# Patient Record
Sex: Female | Born: 1980 | Race: White | Hispanic: No | Marital: Married | Smoking: Former smoker
Health system: Southern US, Community
[De-identification: ages and names within clinical notes are randomized; demographics above are authoritative.]

## PROBLEM LIST (undated history)

## (undated) DIAGNOSIS — A6000 Herpesviral infection of urogenital system, unspecified: Secondary | ICD-10-CM

## (undated) DIAGNOSIS — N920 Excessive and frequent menstruation with regular cycle: Secondary | ICD-10-CM

## (undated) DIAGNOSIS — N840 Polyp of corpus uteri: Secondary | ICD-10-CM

## (undated) DIAGNOSIS — K2960 Other gastritis without bleeding: Secondary | ICD-10-CM

## (undated) HISTORY — DX: Other gastritis without bleeding: K29.60

## (undated) HISTORY — DX: Polyp of corpus uteri: N84.0

## (undated) HISTORY — DX: Excessive and frequent menstruation with regular cycle: N92.0

## (undated) HISTORY — DX: Herpesviral infection of urogenital system, unspecified: A60.00

## (undated) HISTORY — PX: UPPER GASTROINTESTINAL ENDOSCOPY: SHX188

---

## 2005-01-19 HISTORY — PX: OTHER SURGICAL HISTORY: SHX169

## 2005-02-11 ENCOUNTER — Ambulatory Visit: Payer: Self-pay | Admitting: Obstetrics and Gynecology

## 2005-04-29 ENCOUNTER — Ambulatory Visit: Payer: Self-pay | Admitting: Obstetrics and Gynecology

## 2005-06-24 ENCOUNTER — Ambulatory Visit: Payer: Self-pay | Admitting: Obstetrics and Gynecology

## 2005-07-03 ENCOUNTER — Ambulatory Visit: Payer: Self-pay | Admitting: Obstetrics and Gynecology

## 2005-08-26 ENCOUNTER — Ambulatory Visit: Payer: Self-pay | Admitting: Obstetrics and Gynecology

## 2005-09-17 ENCOUNTER — Inpatient Hospital Stay: Payer: Self-pay | Admitting: Obstetrics and Gynecology

## 2005-10-29 ENCOUNTER — Ambulatory Visit: Payer: Self-pay | Admitting: Obstetrics and Gynecology

## 2005-10-29 ENCOUNTER — Inpatient Hospital Stay: Payer: Self-pay | Admitting: Obstetrics and Gynecology

## 2006-12-11 ENCOUNTER — Ambulatory Visit: Payer: Self-pay | Admitting: Obstetrics and Gynecology

## 2009-02-24 ENCOUNTER — Ambulatory Visit: Payer: Self-pay | Admitting: Internal Medicine

## 2009-04-04 ENCOUNTER — Ambulatory Visit: Payer: Self-pay | Admitting: Physician Assistant

## 2011-01-24 ENCOUNTER — Inpatient Hospital Stay: Payer: Self-pay

## 2011-01-24 LAB — CBC WITH DIFFERENTIAL/PLATELET
Basophil #: 0 10*3/uL (ref 0.0–0.1)
Eosinophil #: 0.1 10*3/uL (ref 0.0–0.7)
Lymphocyte #: 1.7 10*3/uL (ref 1.0–3.6)
MCH: 32 pg (ref 26.0–34.0)
MCHC: 34.2 g/dL (ref 32.0–36.0)
MCV: 94 fL (ref 80–100)
Monocyte #: 0.8 10*3/uL — ABNORMAL HIGH (ref 0.0–0.7)
Platelet: 187 10*3/uL (ref 150–440)
RDW: 13.4 % (ref 11.5–14.5)

## 2011-01-26 LAB — HEMATOCRIT: HCT: 32.2 % — ABNORMAL LOW (ref 35.0–47.0)

## 2012-09-02 ENCOUNTER — Ambulatory Visit: Payer: Self-pay | Admitting: Family Medicine

## 2012-09-02 LAB — RAPID STREP-A WITH REFLX: Micro Text Report: POSITIVE

## 2012-11-10 ENCOUNTER — Ambulatory Visit: Payer: Self-pay

## 2012-12-25 ENCOUNTER — Ambulatory Visit: Payer: Self-pay | Admitting: Internal Medicine

## 2013-01-17 ENCOUNTER — Ambulatory Visit: Payer: Self-pay

## 2013-01-19 LAB — BETA STREP CULTURE(ARMC)

## 2013-12-19 DIAGNOSIS — N840 Polyp of corpus uteri: Secondary | ICD-10-CM

## 2013-12-19 HISTORY — DX: Polyp of corpus uteri: N84.0

## 2014-01-19 HISTORY — PX: OTHER SURGICAL HISTORY: SHX169

## 2014-04-25 ENCOUNTER — Encounter: Payer: Self-pay | Admitting: *Deleted

## 2014-11-22 ENCOUNTER — Encounter: Payer: Self-pay | Admitting: Physician Assistant

## 2014-11-22 ENCOUNTER — Ambulatory Visit: Payer: Self-pay | Admitting: Physician Assistant

## 2014-11-22 VITALS — BP 104/78 | HR 76 | Temp 99.3°F

## 2014-11-22 DIAGNOSIS — J018 Other acute sinusitis: Secondary | ICD-10-CM

## 2014-11-22 MED ORDER — FLUCONAZOLE 150 MG PO TABS: 150.0000 mg | ORAL_TABLET | Freq: Once | ORAL | Status: DC

## 2014-11-22 MED ORDER — CEFDINIR 300 MG PO CAPS: 300.0000 mg | ORAL_CAPSULE | Freq: Two times a day (BID) | ORAL | Status: DC

## 2014-11-22 MED ORDER — PREDNISONE 10 MG PO TABS: 30.0000 mg | ORAL_TABLET | Freq: Every day | ORAL | Status: DC

## 2014-11-22 NOTE — Progress Notes (Signed)
S: C/o runny nose and congestion for 6 days, + fever, chills, denies cp/sob, v/d; c/o of facial pain at r side of forehead, headache  Using otc meds:   O: PE: vitals wnl, nad, perrl eomi, normocephalic, tms dull, nasal mucosa red and swollen, throat injected, neck supple no lymph, lungs c t a, cv rrr, neuro intact  A:  Acute sinusitis   P: omnicef 3000mg  bid x 10d, prednisone 30mg  qd x 3d, diflucan 150mg  drink fluids, continue regular meds , use otc meds of choice, return if not improving in 5 days, return earlier if worsening

## 2015-02-21 DIAGNOSIS — N920 Excessive and frequent menstruation with regular cycle: Secondary | ICD-10-CM | POA: Diagnosis not present

## 2015-02-21 DIAGNOSIS — Z124 Encounter for screening for malignant neoplasm of cervix: Secondary | ICD-10-CM | POA: Diagnosis not present

## 2015-02-21 DIAGNOSIS — Z0000 Encounter for general adult medical examination without abnormal findings: Secondary | ICD-10-CM | POA: Diagnosis not present

## 2015-02-21 DIAGNOSIS — Z1321 Encounter for screening for nutritional disorder: Secondary | ICD-10-CM | POA: Diagnosis not present

## 2015-02-21 DIAGNOSIS — Z1151 Encounter for screening for human papillomavirus (HPV): Secondary | ICD-10-CM | POA: Diagnosis not present

## 2015-02-21 DIAGNOSIS — Z13220 Encounter for screening for lipoid disorders: Secondary | ICD-10-CM | POA: Diagnosis not present

## 2015-02-21 DIAGNOSIS — Z01419 Encounter for gynecological examination (general) (routine) without abnormal findings: Secondary | ICD-10-CM | POA: Diagnosis not present

## 2015-02-21 DIAGNOSIS — Z131 Encounter for screening for diabetes mellitus: Secondary | ICD-10-CM | POA: Diagnosis not present

## 2015-03-14 ENCOUNTER — Ambulatory Visit: Payer: Self-pay | Admitting: Physician Assistant

## 2015-03-14 ENCOUNTER — Encounter: Payer: Self-pay | Admitting: Physician Assistant

## 2015-03-14 VITALS — BP 106/74 | HR 56 | Temp 98.3°F

## 2015-03-14 DIAGNOSIS — R509 Fever, unspecified: Secondary | ICD-10-CM

## 2015-03-14 DIAGNOSIS — R300 Dysuria: Secondary | ICD-10-CM

## 2015-03-14 LAB — POCT INFLUENZA A/B
INFLUENZA A, POC: NEGATIVE
INFLUENZA B, POC: NEGATIVE

## 2015-03-14 LAB — POCT URINALYSIS DIPSTICK
Bilirubin, UA: NEGATIVE
GLUCOSE UA: NEGATIVE
Ketones, UA: NEGATIVE
LEUKOCYTES UA: NEGATIVE
NITRITE UA: NEGATIVE
Protein, UA: NEGATIVE
Spec Grav, UA: 1.02
UROBILINOGEN UA: 0.2
pH, UA: 6.5

## 2015-03-14 LAB — POCT RAPID STREP A (OFFICE): RAPID STREP A SCREEN: NEGATIVE

## 2015-03-14 NOTE — Progress Notes (Signed)
S: c/o sore throat, fever, chills, sweats, no cough or congestion, body is a little sore, feels like lymph nodes in groin are swollen, no v/d; lmp now  O: vitals wnl, nad, pt appears tired, tms clear, throat injected, neck supple no lymph lungs c t a, cv rrr, q strep neg, flu swab neg, ua normal  A: viral illness  P: reassurance, fluids, rest, otc meds of choice, return if worsening

## 2015-04-22 ENCOUNTER — Encounter: Payer: Self-pay | Admitting: Physician Assistant

## 2015-04-22 ENCOUNTER — Ambulatory Visit: Payer: Self-pay | Admitting: Physician Assistant

## 2015-04-22 ENCOUNTER — Encounter: Payer: Self-pay | Admitting: *Deleted

## 2015-04-22 VITALS — BP 110/80 | HR 72 | Temp 98.6°F

## 2015-04-22 DIAGNOSIS — R590 Localized enlarged lymph nodes: Secondary | ICD-10-CM

## 2015-04-22 NOTE — Progress Notes (Signed)
S: c/o swollen area with pressure under left axilla, states has felt like this area was different for awhile but just recently has been able to feel node?; had a breast lift with saline implants done this past year, doesn't think symptoms are from this, same sx about 3 years ago , had mammogram and [place] which was normal, doesn't feel its due to her breast, thinks its more like a lymph node  O: vitals wnl, nad, left axilla +long oblong smooth node, mobile, minimally tender, no redness or drainage, n/v intact  A: left axilla pain with lympadenopathy  P: f/u with surgeon for eval,

## 2015-04-23 NOTE — Progress Notes (Signed)
Patient ID: Shelby Young, female   DOB: 07-05-1980, 35 y.o.   MRN: [id number] [facility] spoke with Wells Guiles. Appointment schedule on May 02, 2015 @ 3:15pm with Dr Bary Castilla.  Patient was informed

## 2015-05-02 ENCOUNTER — Ambulatory Visit: Payer: Self-pay | Admitting: General Surgery

## 2015-05-06 ENCOUNTER — Encounter: Payer: Self-pay | Admitting: General Surgery

## 2015-05-06 ENCOUNTER — Ambulatory Visit (INDEPENDENT_AMBULATORY_CARE_PROVIDER_SITE_OTHER): Payer: 59 | Admitting: General Surgery

## 2015-05-06 DIAGNOSIS — R2232 Localized swelling, mass and lump, left upper limb: Secondary | ICD-10-CM | POA: Diagnosis not present

## 2015-05-06 NOTE — Progress Notes (Signed)
Patient ID: Shelby Young, female   DOB: Jan 07, 1981, 35 y.o.   MRN: [id number]  Chief Complaint  Patient presents with  . Other    left axillary swelling    HPI Shelby Young is a 35 y.o. female here for an evaluation of swelling and fullness in the left axilla. She first noticed this about 3 years ago. She had a left mammogram and ultrasound done in 2014 that was negative. She states that they did not image the axillary region at that time. She reports that she can feel a fullness in the area and she reports some discomfort with palpation. She reports that the fullness comes and goes. She states that is started bothering her again about 3 weeks ago.  The patient reports regular menses. She has not noticed a correlation between axillary discomfort and episodes of her menses.  She reports the area of concern is about the size of a marble but waxes and wanes. She did have difficulty with mastitis with her first 2 pregnancies but none with her third.  The patient underwent bilateral mastopexy last year.  I personally reviewed the patient history.   HPI  No past medical history on file.  Past Surgical History  Procedure Laterality Date  . Surgery for iud removal      Repair of uterus-perforation from IUD  . Breast lift  2016    Family History  Problem Relation Age of Onset  . Breast cancer Paternal Grandmother     Social History Social History  Substance Use Topics  . Smoking status: Former Smoker -- 1.00 packs/day for 2 years    Types: Cigarettes  . Smokeless tobacco: Never Used  . Alcohol Use: 0.0 oz/week    0 Standard drinks or equivalent per week    Allergies  Allergen Reactions  . Demerol [Meperidine]   . Phenergan [Promethazine]     No current outpatient prescriptions on file.   No current facility-administered medications for this visit.    Review of Systems Review of Systems  Constitutional: Negative.   Respiratory: Negative.   Cardiovascular: Negative.      Blood pressure 120/68, pulse 58, resp. rate 13, height 5\' 1"  (1.549 m), weight 123 lb (55.792 kg), last menstrual period 04/30/2015.  Physical Exam Physical Exam  Constitutional: She is oriented to person, place, and time. She appears well-developed and well-nourished.  Eyes: Conjunctivae are normal. No scleral icterus.  Neck: Neck supple.  Cardiovascular: Normal rate, regular rhythm and normal heart sounds.   Pulmonary/Chest: Effort normal and breath sounds normal. Right breast exhibits no inverted nipple, no mass, no nipple discharge, no skin change and no tenderness. Left breast exhibits no inverted nipple, no mass, no nipple discharge, no skin change and no tenderness.    Lymphadenopathy:    She has no cervical adenopathy.    She has no axillary adenopathy.       Right: No supraclavicular adenopathy present.       Left: No supraclavicular adenopathy present.  Neurological: She is alert and oriented to person, place, and time.  Skin: Skin is warm and dry.  Psychiatric: She has a normal mood and affect.    Data Reviewed 11/10/2012 left breast mammogram and ultrasound: BI-RADS-1.  Assessment    Benign clinical exam.    Plan    Patient was encouraged to call if the swelling recurs. She is also been asked by attention if it correlates with menses. No indication for additional imaging at this time.  Ref: Ashok Cordia PA) Dr Rosanna Randy PCP: none This has been scribed by Caryl-Lyn Otis Brace LPN   Robert Bellow 05/07/2015, 6:56 PM

## 2015-05-07 DIAGNOSIS — R2230 Localized swelling, mass and lump, unspecified upper limb: Secondary | ICD-10-CM | POA: Insufficient documentation

## 2015-07-31 DIAGNOSIS — D1801 Hemangioma of skin and subcutaneous tissue: Secondary | ICD-10-CM | POA: Diagnosis not present

## 2015-07-31 DIAGNOSIS — L538 Other specified erythematous conditions: Secondary | ICD-10-CM | POA: Diagnosis not present

## 2015-07-31 DIAGNOSIS — B078 Other viral warts: Secondary | ICD-10-CM | POA: Diagnosis not present

## 2015-07-31 DIAGNOSIS — D485 Neoplasm of uncertain behavior of skin: Secondary | ICD-10-CM | POA: Diagnosis not present

## 2015-07-31 DIAGNOSIS — Z789 Other specified health status: Secondary | ICD-10-CM | POA: Diagnosis not present

## 2015-07-31 DIAGNOSIS — Z1283 Encounter for screening for malignant neoplasm of skin: Secondary | ICD-10-CM | POA: Diagnosis not present

## 2015-07-31 DIAGNOSIS — L821 Other seborrheic keratosis: Secondary | ICD-10-CM | POA: Diagnosis not present

## 2015-07-31 DIAGNOSIS — R208 Other disturbances of skin sensation: Secondary | ICD-10-CM | POA: Diagnosis not present

## 2015-07-31 DIAGNOSIS — R238 Other skin changes: Secondary | ICD-10-CM | POA: Diagnosis not present

## 2015-08-13 DIAGNOSIS — D485 Neoplasm of uncertain behavior of skin: Secondary | ICD-10-CM | POA: Diagnosis not present

## 2015-08-13 DIAGNOSIS — D225 Melanocytic nevi of trunk: Secondary | ICD-10-CM | POA: Diagnosis not present

## 2015-08-20 DIAGNOSIS — L538 Other specified erythematous conditions: Secondary | ICD-10-CM | POA: Diagnosis not present

## 2015-08-20 DIAGNOSIS — B078 Other viral warts: Secondary | ICD-10-CM | POA: Diagnosis not present

## 2015-08-20 DIAGNOSIS — R238 Other skin changes: Secondary | ICD-10-CM | POA: Diagnosis not present

## 2015-08-20 DIAGNOSIS — L298 Other pruritus: Secondary | ICD-10-CM | POA: Diagnosis not present

## 2015-08-20 DIAGNOSIS — Z789 Other specified health status: Secondary | ICD-10-CM | POA: Diagnosis not present

## 2015-10-09 DIAGNOSIS — R102 Pelvic and perineal pain: Secondary | ICD-10-CM | POA: Diagnosis not present

## 2015-10-09 DIAGNOSIS — R1031 Right lower quadrant pain: Secondary | ICD-10-CM | POA: Diagnosis not present

## 2015-12-26 DIAGNOSIS — M79671 Pain in right foot: Secondary | ICD-10-CM | POA: Diagnosis not present

## 2015-12-26 DIAGNOSIS — M722 Plantar fascial fibromatosis: Secondary | ICD-10-CM | POA: Diagnosis not present

## 2016-01-08 ENCOUNTER — Ambulatory Visit: Payer: 59 | Attending: Orthopedic Surgery | Admitting: Physical Therapy

## 2016-01-08 DIAGNOSIS — M79672 Pain in left foot: Secondary | ICD-10-CM | POA: Diagnosis not present

## 2016-01-08 DIAGNOSIS — M79671 Pain in right foot: Secondary | ICD-10-CM | POA: Insufficient documentation

## 2016-01-09 NOTE — Therapy (Addendum)
[facility] [address]. [place], [place], [address] Phone: [phone]   Fax:  [phone]  Physical Therapy Evaluation  Patient Details  Name: Shelby Young MRN: [id number] Date of Birth: 02/02/1980 Referring Provider: Dr. Mack Guise  Encounter Date: 01/08/2016  1 of 4 treatments  History reviewed. No pertinent past medical history.  Past Surgical History:  Procedure Laterality Date  . breast lift  2016  . surgery for IUD removal     Repair of uterus-perforation from IUD    There were no vitals filed for this visit.        PT Education - 01/13/16 2050    Education provided Yes   Education Details Discussed orthotics/ heel cushion and proper footwear.  Discussed return to gym based ex./ use of Elliptical to decrease heel strike with running/ TM use.     Person(s) Educated Patient   Methods Explanation;Demonstration   Comprehension Verbalized understanding;Returned demonstration;Verbal cues required      Pt. reports not being able to return to running program since Thanksgiving due to R>L plantar fascia/ heel pain.  Pt. described a detailed running program (13 miles on 11/04/15 with varying distances over following month but limited but progressive worsening in pain symptoms).  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    Discussed HEP/ stretches/ daily icing 3x/day with frozen water bottle.  Proper shoe wear/ orthotic or heel cushion with work shoes and running shoes.  Stretching gastroc before/after ex.    Pt. is a pleasant 35 y/o female with >1 month of B heel/plantar fascia pain (R>L) during running program.  Pt. reports no pain currently at rest but significant R heel/mid-plantar foot pain with palpation/ stretches.  B ankle AROM and strength WNL.  Greater tenderness over R plantar aspect of R foot as compared to L.  No pain during great toe extension.  Good foot posture/ arch in standing  but may benefit from use of orthotic/ heel cushion during long distance running.  Pt. will benefit from short-term skilled PT services to focus on B plantar fascia stretching/ education in proper shoewear/ running technique to promote return to long distance running without pain or limitaitons.          PT Long Term Goals - 01/13/16 2058      PT LONG TERM GOAL #1   Title Pt. will be indepedent with proper HEP/ gym based ex. program to promote return to running program 3-4x/week.    Baseline currently not participating with ex. or running due to pain.    Time 4   Period Weeks   Status New     PT LONG TERM GOAL #2   Title Pt. will increase LEFS to >76 out of 80 to promote return to gym based/ running program.     Baseline LEFS: 67 out of 80 on 01/08/16   Time 4   Period Weeks   Status New     PT LONG TERM GOAL #3   Title Pt. will report no tenderness/ pain with palpation to R heel/ calcaneous to promote return to running.    Baseline (+) R heel tenderness.    Time 4   Period Weeks   Status New      Patient will benefit from skilled therapeutic intervention in order to improve the following deficits and impairments:  Pain, Decreased range of motion, Decreased strength, Decreased activity tolerance  Visit Diagnosis: Pain in left foot  Pain in right foot  Heel pain, bilateral     Problem List Patient Active Problem List   Diagnosis Date Noted  . Axillary mass 05/07/2015   Pura Spice, PT, DPT # [id number] 01/13/2016, 9:01 PM  [facility] [address] [place], [place], [address] Phone: [phone]   Fax:  [phone]  Name: Shelby Young MRN: [id number] Date of Birth: 12-26-1980

## 2016-01-13 ENCOUNTER — Encounter: Payer: Self-pay | Admitting: Physical Therapy

## 2016-01-13 NOTE — Addendum Note (Signed)
Addended by: Pura Spice on: 01/13/2016 09:07 PM   Modules accepted: Orders

## 2016-01-14 NOTE — Progress Notes (Deleted)
[facility] [address] [place], [place] [address] Phone: [phone] Subjective:    I'm seeing this patient by the request  of:    CC: Bilateral heel pain  [id number]  Shelby Young is a 35 y.o. female coming in with complaint of bilateral heel pain. Patient is had this for approximately 1 month. Patient was doing the running program and was increasing the amount of running. Patient is also a emergency room nurse. Doesn't significant amount walking. Patient states     No past medical history on file. Past Surgical History:  Procedure Laterality Date  . breast lift  2016  . surgery for IUD removal     Repair of uterus-perforation from IUD   Social History   Social History  . Marital status: Married    Spouse name: N/A  . Number of children: N/A  . Years of education: N/A   Social History Main Topics  . Smoking status: Former Smoker    Packs/day: 1.00    Years: 2.00    Types: Cigarettes  . Smokeless tobacco: Never Used  . Alcohol use 0.0 oz/week  . Drug use: No  . Sexual activity: Not on file   Other Topics Concern  . Not on file   Social History Narrative  . No narrative on file   Allergies  Allergen Reactions  . Demerol [Meperidine]   . Phenergan [Promethazine]    Family History  Problem Relation Age of Onset  . Breast cancer Paternal Grandmother     Past medical history, social, surgical and family history all reviewed in electronic medical record.  No pertanent information unless stated regarding to the chief complaint.   Review of Systems:Review of systems updated and as accurate as of 01/14/16  No headache, visual changes, nausea, vomiting, diarrhea, constipation, dizziness, abdominal pain, skin rash, fevers, chills, night sweats, weight loss, swollen lymph nodes, body aches, joint swelling, muscle aches, chest pain, shortness of breath, mood changes.   Objective  There were no vitals taken for this visit. Systems  examined below as of 01/14/16   General: No apparent distress alert and oriented x3 mood and affect normal, dressed appropriately.  HEENT: Pupils equal, extraocular movements intact  Respiratory: Patient's speak in full sentences and does not appear short of breath  Cardiovascular: No lower extremity edema, non tender, no erythema  Skin: Warm dry intact with no signs of infection or rash on extremities or on axial skeleton.  Abdomen: Soft nontender  Neuro: Cranial nerves II through XII are intact, neurovascularly intact in all extremities with 2+ DTRs and 2+ pulses.  Lymph: No lymphadenopathy of posterior or anterior cervical chain or axillae bilaterally.  Gait normal with good balance and coordination.  MSK:  Non tender with full range of motion and good stability and symmetric strength and tone of shoulders, elbows, wrist, hip, knee bilaterally.  Ankle: No visible erythema or swelling. Range of motion is full in all directions. Strength is 5/5 in all directions. Stable lateral and medial ligaments; squeeze test and kleiger test unremarkable; Talar dome nontender; No pain at base of 5th MT; No tenderness over cuboid; No tenderness over N spot or navicular prominence No tenderness on posterior aspects of lateral and medial malleolus No sign of peroneal tendon subluxations or tenderness to palpation Negative tarsal tunnel tinel's Able to walk 4 steps.   Impression and Recommendations:     This case required medical decision making of moderate complexity.  Note: This dictation was prepared with Dragon dictation along with smaller phrase technology. Any transcriptional errors that result from this process are unintentional.

## 2016-01-15 ENCOUNTER — Ambulatory Visit: Payer: 59 | Admitting: Family Medicine

## 2016-01-20 DIAGNOSIS — K2960 Other gastritis without bleeding: Secondary | ICD-10-CM

## 2016-01-20 HISTORY — DX: Other gastritis without bleeding: K29.60

## 2016-01-22 ENCOUNTER — Encounter: Payer: 59 | Admitting: Physical Therapy

## 2016-04-03 ENCOUNTER — Encounter: Payer: Self-pay | Admitting: Physician Assistant

## 2016-04-03 ENCOUNTER — Ambulatory Visit: Payer: Self-pay | Admitting: Physician Assistant

## 2016-04-03 VITALS — BP 100/70 | HR 77 | Temp 98.0°F

## 2016-04-03 DIAGNOSIS — R140 Abdominal distension (gaseous): Secondary | ICD-10-CM

## 2016-04-03 DIAGNOSIS — R1310 Dysphagia, unspecified: Secondary | ICD-10-CM

## 2016-04-03 NOTE — Progress Notes (Signed)
S: c/o feeling like she needs to burp and cannot get the burp out, sx for a few months, states she tired several otc meds without relief, the other night she drank a beer and that helped her burp, feels like its pressure from the epigastric area pushing up, also several times has felt like she is choking on her food, pain in epigastric radiates through to her spine, no cp/sob, no v/d, no dif with stools  O: vitals wnl, throat wnl, neck supple no lymph, lungs c t a, cv rrr, abd soft nontender bs normal all 4 quads  A: dysphagia, bloating  P: f/u with GI for eval, try otc Align probiotic

## 2016-04-06 NOTE — Progress Notes (Signed)
Contacted patient  to confirm that Dr. Allen Norris office(GI) scheduled appt for 04/28/2016. Per patient she did receive call.

## 2016-04-28 ENCOUNTER — Ambulatory Visit (INDEPENDENT_AMBULATORY_CARE_PROVIDER_SITE_OTHER): Payer: 59 | Admitting: Gastroenterology

## 2016-04-28 ENCOUNTER — Encounter: Payer: Self-pay | Admitting: Gastroenterology

## 2016-04-28 ENCOUNTER — Other Ambulatory Visit: Payer: Self-pay

## 2016-04-28 VITALS — BP 107/71 | HR 71 | Temp 98.7°F | Ht 61.0 in | Wt 126.0 lb

## 2016-04-28 DIAGNOSIS — R1319 Other dysphagia: Secondary | ICD-10-CM

## 2016-04-28 DIAGNOSIS — R1013 Epigastric pain: Secondary | ICD-10-CM | POA: Diagnosis not present

## 2016-04-28 DIAGNOSIS — R1310 Dysphagia, unspecified: Secondary | ICD-10-CM | POA: Diagnosis not present

## 2016-04-28 DIAGNOSIS — H5203 Hypermetropia, bilateral: Secondary | ICD-10-CM | POA: Diagnosis not present

## 2016-04-28 NOTE — Progress Notes (Signed)
Gastroenterology Consultation  Referring Provider:     Ashok Cordia PA-C Primary Care Physician:  No PCP Per Patient Primary Gastroenterologist:  Dr. Allen Norris     Reason for Consultation:     Dysphagia        HPI:   Shelby Young is a 36 y.o. y/o female referred for consultation & management of Dysphagia by Dr. Rayne Du PCP Per Patient.  Patient comes today with a history of dysphagia. The patient reports that she sometimes has to live for causing her to choke. The patient also reports that she has had episodes of what she feels like food getting stuck in her esophagus when she eats granola bars. The patient denies any unexplained weight loss. The patient also reports that she has a lot of bloating in her stomach and feels like gas is trapped. The patient does drink carbonated drinks chews gum and drinks coffee. The patient states that after she burps she feels that there is more air, but she is unable to burp up the air. The patient also reports that she has had episodes approximately 3 times a year of rectal spasms. There is no report of any overt heartburn.  History reviewed. No pertinent past medical history.  Past Surgical History:  Procedure Laterality Date  . breast lift  2016  . surgery for IUD removal     Repair of uterus-perforation from IUD    Prior to Admission medications   Not on File    Family History  Problem Relation Age of Onset  . Breast cancer Paternal Grandmother      Social History  Substance Use Topics  . Smoking status: Former Smoker    Packs/day: 1.00    Years: 2.00    Types: Cigarettes  . Smokeless tobacco: Never Used  . Alcohol use 0.0 oz/week    Allergies as of 04/28/2016 - Review Complete 04/28/2016  Allergen Reaction Noted  . Demerol [meperidine]  11/22/2014  . Phenergan [promethazine]  11/22/2014    Review of Systems:    All systems reviewed and negative except where noted in HPI.   Physical Exam:  BP 107/71   Pulse 71   Temp 98.7 F (37.1  C) (Oral)   Ht 5\' 1"  (1.549 m)   Wt 126 lb (57.2 kg)   BMI 23.81 kg/m  No LMP recorded. Psych:  Alert and cooperative. Normal mood and affect. General:   Alert,  Well-developed, well-nourished, pleasant and cooperative in NAD Head:  Normocephalic and atraumatic. Eyes:  Sclera clear, no icterus.   Conjunctiva pink. Ears:  Normal auditory acuity. Nose:  No deformity, discharge, or lesions. Mouth:  No deformity or lesions,oropharynx pink & moist. Neck:  Supple; no masses or thyromegaly. Lungs:  Respirations even and unlabored.  Clear throughout to auscultation.   No wheezes, crackles, or rhonchi. No acute distress. Heart:  Regular rate and rhythm; no murmurs, clicks, rubs, or gallops. Abdomen:  Normal bowel sounds.  No bruits.  Soft, non-tender and non-distended without masses, hepatosplenomegaly or hernias noted.  No guarding or rebound tenderness.  Negative Carnett sign.   Rectal:  Deferred.  Msk:  Symmetrical without gross deformities.  Good, equal movement & strength bilaterally. Pulses:  Normal pulses noted. Extremities:  No clubbing or edema.  No cyanosis. Neurologic:  Alert and oriented x3;  grossly normal neurologically. Skin:  Intact without significant lesions or rashes.  No jaundice. Lymph Nodes:  No significant cervical adenopathy. Psych:  Alert and cooperative. Normal mood and affect.  Imaging Studies: No results found.  Assessment and Plan:   Shelby Young is a 36 y.o. y/o female who comes in with rectal spasms and the patient has been told to massage the area to see if the spasms will abate. The patient also reports that she has some dysphagia. The patient's dysphagia is intermittent. The patient will be set up for an EGD. The patient has also been told to try and avoid foods and activities that increase swallowing of air. The patient has been explained the plan and agrees with it.    Lucilla Lame, MD. Marval Regal   Note: This dictation was prepared with Dragon dictation  along with smaller phrase technology. Any transcriptional errors that result from this process are unintentional.

## 2016-05-07 ENCOUNTER — Encounter: Payer: Self-pay | Admitting: *Deleted

## 2016-05-11 NOTE — Discharge Instructions (Signed)

## 2016-05-14 ENCOUNTER — Ambulatory Visit
Admission: RE | Admit: 2016-05-14 | Discharge: 2016-05-14 | Disposition: A | Discharge status: Home / Self Care | Payer: 59 | Source: Ambulatory Visit | Attending: Gastroenterology | Admitting: Gastroenterology

## 2016-05-14 ENCOUNTER — Encounter
Admission: RE | Disposition: A | Discharge status: Home / Self Care | Payer: Self-pay | Source: Ambulatory Visit | Attending: Gastroenterology

## 2016-05-14 ENCOUNTER — Ambulatory Visit: Payer: 59 | Admitting: Anesthesiology

## 2016-05-14 DIAGNOSIS — R1310 Dysphagia, unspecified: Secondary | ICD-10-CM | POA: Diagnosis not present

## 2016-05-14 DIAGNOSIS — K2970 Gastritis, unspecified, without bleeding: Secondary | ICD-10-CM

## 2016-05-14 DIAGNOSIS — Z87891 Personal history of nicotine dependence: Secondary | ICD-10-CM | POA: Diagnosis not present

## 2016-05-14 HISTORY — PX: ESOPHAGOGASTRODUODENOSCOPY (EGD) WITH PROPOFOL: SHX5813

## 2016-05-14 SURGERY — ESOPHAGOGASTRODUODENOSCOPY (EGD) WITH PROPOFOL
Anesthesia: Monitor Anesthesia Care | Wound class: Clean Contaminated

## 2016-05-14 MED ORDER — PROPOFOL 10 MG/ML IV BOLUS
INTRAVENOUS | Status: DC | PRN
  Administered 2016-05-14 (×3): 50 mg via INTRAVENOUS
  Administered 2016-05-14: 100 mg via INTRAVENOUS

## 2016-05-14 MED ORDER — ACETAMINOPHEN 325 MG PO TABS: 325.0000 mg | ORAL_TABLET | ORAL | Status: DC | PRN

## 2016-05-14 MED ORDER — ACETAMINOPHEN 160 MG/5ML PO SOLN: 325.0000 mg | ORAL | Status: DC | PRN

## 2016-05-14 MED ORDER — GLYCOPYRROLATE 0.2 MG/ML IJ SOLN
INTRAMUSCULAR | Status: DC | PRN
  Administered 2016-05-14: 0.1 mg via INTRAVENOUS

## 2016-05-14 MED ORDER — LACTATED RINGERS IV SOLN
INTRAVENOUS | Status: DC
  Administered 2016-05-14: 09:00:00 via INTRAVENOUS

## 2016-05-14 MED ORDER — LIDOCAINE HCL (CARDIAC) 20 MG/ML IV SOLN
INTRAVENOUS | Status: DC | PRN
  Administered 2016-05-14: 50 mg via INTRAVENOUS

## 2016-05-14 SURGICAL SUPPLY — 32 items
BALLN DILATOR 10-12 8 (BALLOONS)
BALLN DILATOR 12-15 8 (BALLOONS)
BALLN DILATOR 15-18 8 (BALLOONS)
BALLN DILATOR CRE 0-12 8 (BALLOONS)
BALLN DILATOR ESOPH 8 10 CRE (MISCELLANEOUS) IMPLANT
BALLOON DILATOR 12-15 8 (BALLOONS) IMPLANT
BALLOON DILATOR 15-18 8 (BALLOONS) IMPLANT
BALLOON DILATOR CRE 0-12 8 (BALLOONS) IMPLANT
BLOCK BITE 60FR ADLT L/F GRN (MISCELLANEOUS) ×3 IMPLANT
CANISTER SUCT 1200ML W/VALVE (MISCELLANEOUS) ×3 IMPLANT
CLIP HMST 235XBRD CATH ROT (MISCELLANEOUS) IMPLANT
CLIP RESOLUTION 360 11X235 (MISCELLANEOUS)
FCP ESCP3.2XJMB 240X2.8X (MISCELLANEOUS)
FORCEPS BIOP RAD 4 LRG CAP 4 (CUTTING FORCEPS) IMPLANT
FORCEPS BIOP RJ4 240 W/NDL (MISCELLANEOUS)
FORCEPS ESCP3.2XJMB 240X2.8X (MISCELLANEOUS) IMPLANT
GOWN CVR UNV OPN BCK APRN NK (MISCELLANEOUS) ×2 IMPLANT
GOWN ISOL THUMB LOOP REG UNIV (MISCELLANEOUS) ×4
INJECTOR VARIJECT VIN23 (MISCELLANEOUS) IMPLANT
KIT DEFENDO VALVE AND CONN (KITS) IMPLANT
KIT ENDO PROCEDURE OLY (KITS) ×3 IMPLANT
MARKER SPOT ENDO TATTOO 5ML (MISCELLANEOUS) IMPLANT
PAD GROUND ADULT SPLIT (MISCELLANEOUS) IMPLANT
RETRIEVER NET PLAT FOOD (MISCELLANEOUS) IMPLANT
SNARE SHORT THROW 13M SML OVAL (MISCELLANEOUS) IMPLANT
SNARE SHORT THROW 30M LRG OVAL (MISCELLANEOUS) IMPLANT
SPOT EX ENDOSCOPIC TATTOO (MISCELLANEOUS)
SYR INFLATION 60ML (SYRINGE) IMPLANT
TRAP ETRAP POLY (MISCELLANEOUS) IMPLANT
VARIJECT INJECTOR VIN23 (MISCELLANEOUS)
WATER STERILE IRR 250ML POUR (IV SOLUTION) ×3 IMPLANT
WIRE CRE 18-20MM 8CM F G (MISCELLANEOUS) IMPLANT

## 2016-05-14 NOTE — Op Note (Signed)
[facility] Gastroenterology Patient Name: Shelby Young Procedure Date: 05/14/2016 8:53 AM MRN: [id number] Account #: [id number] Date of Birth: Feb 15, 1980 Admit Type: Outpatient Age: 36 Room: [facility] OR ROOM 01 Gender: Female Note Status: Finalized Procedure:            Upper GI endoscopy Indications:          Dysphagia Providers:            Lucilla Lame MD, MD Referring MD:         Versie Starks, MD (Referring MD) Medicines:            Propofol per Anesthesia Complications:        No immediate complications. Procedure:            Pre-Anesthesia Assessment:                       - Prior to the procedure, a History and Physical was                        performed, and patient medications and allergies were                        reviewed. The patient's tolerance of previous                        anesthesia was also reviewed. The risks and benefits of                        the procedure and the sedation options and risks were                        discussed with the patient. All questions were                        answered, and informed consent was obtained. Prior                        Anticoagulants: The patient has taken no previous                        anticoagulant or antiplatelet agents. ASA Grade                        Assessment: II - A patient with mild systemic disease.                        After reviewing the risks and benefits, the patient was                        deemed in satisfactory condition to undergo the                        procedure.                       After obtaining informed consent, the endoscope was                        passed under direct vision. Throughout the procedure,  the patient's blood pressure, pulse, and oxygen                        saturations were monitored continuously. The Olympus                        GIF H180J Endoscope (U#:[id number]) was introduced through                        the  mouth, and advanced to the second part of duodenum.                        The upper GI endoscopy was accomplished without                        difficulty. The patient tolerated the procedure well. Findings:      The examined esophagus was normal. Three biopsies were obtained in the       middle third of the esophagus with cold forceps for histology.      Localized mild inflammation characterized by erythema was found in the       gastric antrum. Biopsies were taken with a cold forceps for histology.      The examined duodenum was normal. Impression:           - Normal esophagus.                       - Gastritis. Biopsied.                       - Normal examined duodenum.                       - Three biopsies were obtained in the middle third of                        the esophagus. Recommendation:       - Discharge patient to home.                       - Resume previous diet.                       - Continue present medications.                       - Await pathology results. Procedure Code(s):    --- Professional ---                       [phone], Esophagogastroduodenoscopy, flexible, transoral;                        with biopsy, single or multiple Diagnosis Code(s):    --- Professional ---                       R13.10, Dysphagia, unspecified                       K29.70, Gastritis, unspecified, without bleeding CPT copyright 2016 American Medical Association. All rights reserved. The codes documented in this report are preliminary and upon coder review may  be revised to meet current compliance requirements. Tvisha Schwoerer  Tabitha Tupper MD, MD 05/14/2016 9:21:13 AM This report has been signed electronically. Number of Addenda: 0 Note Initiated On: 05/14/2016 8:53 AM      [facility]

## 2016-05-14 NOTE — Anesthesia Preprocedure Evaluation (Signed)
Anesthesia Evaluation  Patient identified by MRN, date of birth, ID band Patient awake    Reviewed: Allergy & Precautions, H&P , NPO status , Patient's Chart, lab work & pertinent test results  Airway Mallampati: II  TM Distance: >3 FB Neck ROM: full    Dental no notable dental hx.    Pulmonary former smoker,    Pulmonary exam normal        Cardiovascular Normal cardiovascular exam     Neuro/Psych    GI/Hepatic   Endo/Other    Renal/GU      Musculoskeletal   Abdominal   Peds  Hematology   Anesthesia Other Findings   Reproductive/Obstetrics                             Anesthesia Physical Anesthesia Plan  ASA: I  Anesthesia Plan: MAC   Post-op Pain Management:    Induction:   Airway Management Planned:   Additional Equipment:   Intra-op Plan:   Post-operative Plan:   Informed Consent: I have reviewed the patients History and Physical, chart, labs and discussed the procedure including the risks, benefits and alternatives for the proposed anesthesia with the patient or authorized representative who has indicated his/her understanding and acceptance.     Plan Discussed with:   Anesthesia Plan Comments:         Anesthesia Quick Evaluation

## 2016-05-14 NOTE — Anesthesia Procedure Notes (Signed)
Procedure Name: MAC Date/Time: 05/14/2016 9:13 AM Performed by: Cameron Ali Pre-anesthesia Checklist: Patient identified, Emergency Drugs available, Suction available, Timeout performed and Patient being monitored Patient Re-evaluated:Patient Re-evaluated prior to inductionOxygen Delivery Method: Nasal cannula Placement Confirmation: positive ETCO2

## 2016-05-14 NOTE — H&P (Signed)
   Shelby Lame, MD [facility] [address], [place], [place] [address] Phone:[phone] Fax : [phone]  Primary Care Physician:  No PCP Per Patient Primary Gastroenterologist:  Dr. Allen Norris  Pre-Procedure History & Physical: HPI:  MARKEISHA Young is a 36 y.o. female is here for an endoscopy.   History reviewed. No pertinent past medical history.  Past Surgical History:  Procedure Laterality Date  . breast lift  2016  . surgery for IUD removal     Repair of uterus-perforation from IUD    Prior to Admission medications   Not on File    Allergies as of 04/28/2016 - Review Complete 04/28/2016  Allergen Reaction Noted  . Demerol [meperidine]  11/22/2014  . Phenergan [promethazine]  11/22/2014    Family History  Problem Relation Age of Onset  . Breast cancer Paternal Grandmother     Social History   Social History  . Marital status: Married    Spouse name: N/A  . Number of children: N/A  . Years of education: N/A   Occupational History  . Not on file.   Social History Main Topics  . Smoking status: Former Smoker    Years: 2.00    Types: Cigarettes  . Smokeless tobacco: Never Used     Comment: smoked approx 1 pack/week in college  . Alcohol use 1.8 oz/week    3 Glasses of wine per week  . Drug use: No  . Sexual activity: Not on file   Other Topics Concern  . Not on file   Social History Narrative  . No narrative on file    Review of Systems: See HPI, otherwise negative ROS  Physical Exam: BP 104/80   Pulse 75   Temp 97.5 F (36.4 C) (Temporal)   Ht 5\' 1"  (1.549 m)   Wt 123 lb (55.8 kg)   LMP 04/20/2016 (Exact Date) Comment: Negative preg test  SpO2 100%   BMI 23.24 kg/m  General:   Alert,  pleasant and cooperative in NAD Head:  Normocephalic and atraumatic. Neck:  Supple; no masses or thyromegaly. Lungs:  Clear throughout to auscultation.    Heart:  Regular rate and rhythm. Abdomen:  Soft, nontender and nondistended. Normal bowel  sounds, without guarding, and without rebound.   Neurologic:  Alert and  oriented x4;  grossly normal neurologically.  Impression/Plan: Shelby Young is here for an endoscopy to be performed for dysphagia  Risks, benefits, limitations, and alternatives regarding  endoscopy have been reviewed with the patient.  Questions have been answered.  All parties agreeable.   Shelby Lame, MD  05/14/2016, 8:20 AM

## 2016-05-14 NOTE — Anesthesia Postprocedure Evaluation (Signed)
Anesthesia Post Note  Patient: Shelby Young  Procedure(s) Performed: Procedure(s) (LRB): ESOPHAGOGASTRODUODENOSCOPY (EGD) WITH PROPOFOL (N/A)  Patient location during evaluation: PACU Anesthesia Type: MAC Level of consciousness: awake and alert and oriented Pain management: satisfactory to patient Vital Signs Assessment: post-procedure vital signs reviewed and stable Respiratory status: spontaneous breathing, nonlabored ventilation and respiratory function stable Cardiovascular status: blood pressure returned to baseline and stable Postop Assessment: Adequate PO intake and No signs of nausea or vomiting Anesthetic complications: no    Raliegh Ip

## 2016-05-14 NOTE — Transfer of Care (Signed)
Immediate Anesthesia Transfer of Care Note  Patient: Shelby Young  Procedure(s) Performed: Procedure(s): ESOPHAGOGASTRODUODENOSCOPY (EGD) WITH PROPOFOL (N/A)  Patient Location: PACU  Anesthesia Type: MAC  Level of Consciousness: awake, alert  and patient cooperative  Airway and Oxygen Therapy: Patient Spontanous Breathing and Patient connected to supplemental oxygen  Post-op Assessment: Post-op Vital signs reviewed, Patient's Cardiovascular Status Stable, Respiratory Function Stable, Patent Airway and No signs of Nausea or vomiting  Post-op Vital Signs: Reviewed and stable  Complications: No apparent anesthesia complications

## 2016-05-15 ENCOUNTER — Encounter: Payer: Self-pay | Admitting: Gastroenterology

## 2016-05-18 ENCOUNTER — Encounter: Payer: Self-pay | Admitting: Gastroenterology

## 2016-06-04 DIAGNOSIS — D492 Neoplasm of unspecified behavior of bone, soft tissue, and skin: Secondary | ICD-10-CM | POA: Diagnosis not present

## 2016-06-04 DIAGNOSIS — Z86018 Personal history of other benign neoplasm: Secondary | ICD-10-CM | POA: Diagnosis not present

## 2016-06-04 DIAGNOSIS — D485 Neoplasm of uncertain behavior of skin: Secondary | ICD-10-CM | POA: Diagnosis not present

## 2016-06-04 DIAGNOSIS — D225 Melanocytic nevi of trunk: Secondary | ICD-10-CM | POA: Diagnosis not present

## 2016-11-19 HISTORY — PX: WISDOM TOOTH EXTRACTION: SHX21

## 2016-12-22 ENCOUNTER — Ambulatory Visit (INDEPENDENT_AMBULATORY_CARE_PROVIDER_SITE_OTHER): Payer: 59 | Admitting: Obstetrics and Gynecology

## 2016-12-22 ENCOUNTER — Encounter: Payer: Self-pay | Admitting: Obstetrics and Gynecology

## 2016-12-22 VITALS — BP 110/80 | HR 60 | Ht 61.0 in | Wt 123.0 lb

## 2016-12-22 DIAGNOSIS — Z13220 Encounter for screening for lipoid disorders: Secondary | ICD-10-CM

## 2016-12-22 DIAGNOSIS — Z0000 Encounter for general adult medical examination without abnormal findings: Secondary | ICD-10-CM

## 2016-12-22 DIAGNOSIS — Z124 Encounter for screening for malignant neoplasm of cervix: Secondary | ICD-10-CM | POA: Diagnosis not present

## 2016-12-22 DIAGNOSIS — Z01419 Encounter for gynecological examination (general) (routine) without abnormal findings: Secondary | ICD-10-CM | POA: Diagnosis not present

## 2016-12-22 NOTE — Patient Instructions (Signed)
I value your feedback and entrusting us with your care. If you get a [place] patient survey, I would appreciate you taking the time to let us know about your experience today. Thank you! 

## 2016-12-22 NOTE — Progress Notes (Signed)
PCP:  Patient, No Pcp Per   Chief Complaint  Patient presents with  . Gynecologic Exam    periodic sciatica pain that travels to places she's never had it travel before     HPI:      [name] is a 36 y.o. G3P3 who LMP was Patient's last menstrual period was 11/23/2016., presents today for her annual examination.  Her menses are regular every 28-30 days, lasting 5 days. Hx of menorrhagia with failed attempt of endometrial ablation in the past.  Dysmenorrhea moderate, occurring first 1-2 days of flow. She does not have intermenstrual bleeding. Midcycle cramping from last fall resolved. Pt had episode of severe RLQ pain a few cycles ago that resolved. Question ovar cyst.   Sex activity: single partner, contraception - vasectomy.  Last Pap: February 21, 2015  Results were: no abnormalities /neg HPV DNA. She likes yearly paps. Hx of STDs: none  There is a FH of breast cancer in her PGM, genetic testing not indicated. There is no FH of ovarian cancer. The patient does do self-breast exams.  Tobacco use: The patient denies current or previous tobacco use. Alcohol use: social drinker No drug use.  Exercise: moderately active  She does get adequate calcium but not Vitamin D in her diet.  She had borderline lipids and Vitamin D deficiency 2016 with normal HgA1C.    Past Medical History:  Diagnosis Date  . Endometrial polyp 12/2013  . Genital herpes    type 1  . Menorrhagia   . Reflux gastritis 2018    Past Surgical History:  Procedure Laterality Date  . breast lift  2016  . ESOPHAGOGASTRODUODENOSCOPY (EGD) WITH PROPOFOL N/A 05/14/2016   Procedure: ESOPHAGOGASTRODUODENOSCOPY (EGD) WITH PROPOFOL;  Surgeon: Lucilla Lame, MD;  Location: [facility];  Service: Endoscopy;  Laterality: N/A;  . surgery for IUD removal  2007   Repair of uterus-perforation from IUD  . UPPER GASTROINTESTINAL ENDOSCOPY     reflux  . WISDOM TOOTH EXTRACTION  11/2016   [age]    Family  History  Problem Relation Age of Onset  . Breast cancer Paternal Grandmother 27    Social History   Socioeconomic History  . Marital status: Married    Spouse name: Not on file  . Number of children: Not on file  . Years of education: Not on file  . Highest education level: Not on file  Social Needs  . Financial resource strain: Not on file  . Food insecurity - worry: Not on file  . Food insecurity - inability: Not on file  . Transportation needs - medical: Not on file  . Transportation needs - non-medical: Not on file  Occupational History  . Not on file  Tobacco Use  . Smoking status: Former Smoker    Years: 2.00    Types: Cigarettes  . Smokeless tobacco: Never Used  . Tobacco comment: smoked approx 1 pack/week in college  Substance and Sexual Activity  . Alcohol use: Yes    Alcohol/week: 1.8 oz    Types: 3 Glasses of wine per week  . Drug use: No  . Sexual activity: Yes    Birth control/protection: Surgical    Comment: vasectomy  Other Topics Concern  . Not on file  Social History Narrative  . Not on file    No outpatient medications have been marked as taking for the 12/22/16 encounter (Office Visit) with Aidynn Krenn, Deirdre Evener, PA-C.     ROS:  Review of  Systems  Constitutional: Negative for fatigue, fever and unexpected weight change.  Respiratory: Negative for cough, shortness of breath and wheezing.   Cardiovascular: Negative for chest pain, palpitations and leg swelling.  Gastrointestinal: Negative for blood in stool, constipation, diarrhea, nausea and vomiting.  Endocrine: Negative for cold intolerance, heat intolerance and polyuria.  Genitourinary: Positive for dyspareunia. Negative for dysuria, flank pain, frequency, genital sores, hematuria, menstrual problem, pelvic pain, urgency, vaginal bleeding, vaginal discharge and vaginal pain.  Musculoskeletal: Negative for back pain, joint swelling and myalgias.  Skin: Negative for rash.  Neurological: Negative  for dizziness, syncope, light-headedness, numbness and headaches.  Hematological: Negative for adenopathy.  Psychiatric/Behavioral: Negative for agitation, confusion, sleep disturbance and suicidal ideas. The patient is not nervous/anxious.      Objective: BP 110/80   Pulse 60   Ht 5\' 1"  (1.549 m)   Wt 123 lb (55.8 kg)   LMP 11/23/2016   BMI 23.24 kg/m    Physical Exam  Constitutional: She is oriented to person, place, and time. She appears well-developed and well-nourished.  Genitourinary: Vagina normal and uterus normal. There is no rash or tenderness on the right labia. There is no rash or tenderness on the left labia. No erythema or tenderness in the vagina. No vaginal discharge found. Right adnexum does not display mass and does not display tenderness. Left adnexum does not display mass and does not display tenderness. Cervix does not exhibit motion tenderness or polyp. Uterus is not enlarged or tender.  Neck: Normal range of motion. No thyromegaly present.  Cardiovascular: Normal rate, regular rhythm and normal heart sounds.  No murmur heard. Pulmonary/Chest: Effort normal and breath sounds normal. Right breast exhibits no mass, no nipple discharge, no skin change and no tenderness. Left breast exhibits no mass, no nipple discharge, no skin change and no tenderness.  Abdominal: Soft. There is no tenderness. There is no guarding.  Musculoskeletal: Normal range of motion.  Neurological: She is alert and oriented to person, place, and time. No cranial nerve deficit.  Psychiatric: She has a normal mood and affect. Her behavior is normal.  Vitals reviewed.   Assessment/Plan: Encounter for annual routine gynecological examination  Cervical cancer screening - Plain pap. Pt prefers yearly paps.  - Plan: Pap IG (Image Guided)  Blood tests for routine general physical examination - Plan: Comprehensive metabolic panel, Lipid panel  Screening cholesterol level - Plan: Lipid  panel  GYN counsel adequate intake of calcium and vitamin D, diet and exercise     F/U  Return in about 1 year (around 12/22/2017).  Allexa Acoff B. Billal Rollo, PA-C 12/22/2016 10:53 AM

## 2016-12-23 LAB — PAP IG (IMAGE GUIDED): PAP SMEAR COMMENT: 0

## 2017-07-30 DIAGNOSIS — M545 Low back pain: Secondary | ICD-10-CM | POA: Diagnosis not present

## 2017-07-30 DIAGNOSIS — M5416 Radiculopathy, lumbar region: Secondary | ICD-10-CM | POA: Diagnosis not present

## 2017-08-11 DIAGNOSIS — L700 Acne vulgaris: Secondary | ICD-10-CM | POA: Diagnosis not present

## 2017-11-23 ENCOUNTER — Ambulatory Visit: Payer: 59 | Admitting: Obstetrics and Gynecology

## 2017-11-23 ENCOUNTER — Encounter: Payer: Self-pay | Admitting: Obstetrics and Gynecology

## 2017-11-23 ENCOUNTER — Ambulatory Visit (INDEPENDENT_AMBULATORY_CARE_PROVIDER_SITE_OTHER): Payer: 59 | Admitting: Obstetrics and Gynecology

## 2017-11-23 ENCOUNTER — Ambulatory Visit (INDEPENDENT_AMBULATORY_CARE_PROVIDER_SITE_OTHER): Payer: 59

## 2017-11-23 VITALS — BP 116/70 | HR 71 | Ht 61.0 in | Wt 128.0 lb

## 2017-11-23 DIAGNOSIS — R1031 Right lower quadrant pain: Secondary | ICD-10-CM

## 2017-11-23 NOTE — Progress Notes (Signed)
Patient, No Pcp Per   Chief Complaint  Patient presents with  . Ovarian pain    right ovarian pain, pt describes it as throbbing and felt swollen, back pain also, since last period    HPI:      [name] is a 37 y.o. G3P3 who LMP was Patient's last menstrual period was 11/12/2017 (exact date)., presents today for RLQ pain. Pain is throbbing and intermittent, and now with increased pressure/feels swollen. Sx started 1-2 wks before LMP but intensity increased during her period. Sx slightly better today. Took meloxicam with questionable improvement. Hx of same sx about this time last yr. Since then, however, has had similar RLQ pain sx but intensity not as bad. No bleeding with sx. Pt is sex active, husband with vasectomy. Occas dyspareunia/no postcoital bleeding. No urin, vag sx with pain. No fevers. No GI sx. Has BMs daily. BM slightly improves pressure but not relief of pain.    Past Medical History:  Diagnosis Date  . Endometrial polyp 12/2013  . Genital herpes    type 1  . Menorrhagia   . Reflux gastritis 2018    Past Surgical History:  Procedure Laterality Date  . breast lift  2016  . ESOPHAGOGASTRODUODENOSCOPY (EGD) WITH PROPOFOL N/A 05/14/2016   Procedure: ESOPHAGOGASTRODUODENOSCOPY (EGD) WITH PROPOFOL;  Surgeon: Lucilla Lame, MD;  Location: [facility];  Service: Endoscopy;  Laterality: N/A;  . surgery for IUD removal  2007   Repair of uterus-perforation from IUD  . UPPER GASTROINTESTINAL ENDOSCOPY     reflux  . WISDOM TOOTH EXTRACTION  11/2016   [age]    Family History  Problem Relation Age of Onset  . Breast cancer Paternal Grandmother 2  . Hypertension Mother   . Diabetes Maternal Grandfather        DM type 2    Social History   Socioeconomic History  . Marital status: Married    Spouse name: Not on file  . Number of children: Not on file  . Years of education: Not on file  . Highest education level: Not on file  Occupational History    . Not on file  Social Needs  . Financial resource strain: Not on file  . Food insecurity:    Worry: Not on file    Inability: Not on file  . Transportation needs:    Medical: Not on file    Non-medical: Not on file  Tobacco Use  . Smoking status: Former Smoker    Years: 2.00    Types: Cigarettes  . Smokeless tobacco: Never Used  . Tobacco comment: smoked approx 1 pack/week in college  Substance and Sexual Activity  . Alcohol use: Yes    Alcohol/week: 3.0 standard drinks    Types: 3 Glasses of wine per week  . Drug use: No  . Sexual activity: Yes    Birth control/protection: Surgical    Comment: vasectomy  Lifestyle  . Physical activity:    Days per week: Not on file    Minutes per session: Not on file  . Stress: Not on file  Relationships  . Social connections:    Talks on phone: Not on file    Gets together: Not on file    Attends religious service: Not on file    Active member of club or organization: Not on file    Attends meetings of clubs or organizations: Not on file    Relationship status: Not on file  . Intimate  partner violence:    Fear of current or ex partner: Not on file    Emotionally abused: Not on file    Physically abused: Not on file    Forced sexual activity: Not on file  Other Topics Concern  . Not on file  Social History Narrative  . Not on file    Outpatient Medications Prior to Visit  Medication Sig Dispense Refill  . chlorhexidine (PERIDEX) 0.12 % solution 1 mL by Mouth Rinse route daily.    . cyclobenzaprine (FLEXERIL) 10 MG tablet cyclobenzaprine 10 mg tablet  Take 1 tablet twice a day by oral route.    Marland Kitchen ibuprofen (ADVIL,MOTRIN) 600 MG tablet Take 1 tablet by mouth as needed.    . meloxicam (MOBIC) 15 MG tablet Take 15 mg by mouth daily.  2   No facility-administered medications prior to visit.     ROS:  Review of Systems  Constitutional: Negative for fatigue, fever and unexpected weight change.  Respiratory: Negative for  cough, shortness of breath and wheezing.   Cardiovascular: Negative for chest pain, palpitations and leg swelling.  Gastrointestinal: Negative for blood in stool, constipation, diarrhea, nausea and vomiting.  Endocrine: Negative for cold intolerance, heat intolerance and polyuria.  Genitourinary: Positive for dyspareunia and pelvic pain. Negative for dysuria, flank pain, frequency, genital sores, hematuria, menstrual problem, urgency, vaginal bleeding, vaginal discharge and vaginal pain.  Musculoskeletal: Negative for back pain, joint swelling and myalgias.  Skin: Negative for rash.  Neurological: Negative for dizziness, syncope, light-headedness, numbness and headaches.  Hematological: Negative for adenopathy.  Psychiatric/Behavioral: Negative for agitation, confusion, sleep disturbance and suicidal ideas. The patient is not nervous/anxious.    BREAST: No symptoms   OBJECTIVE:   Vitals:  BP 116/70   Pulse 71   Ht 5\' 1"  (1.549 m)   Wt 128 lb (58.1 kg)   LMP 11/12/2017 (Exact Date)   BMI 24.19 kg/m   Physical Exam  Constitutional: She is oriented to person, place, and time. Vital signs are normal. She appears well-developed.  Pulmonary/Chest: Effort normal.  Abdominal: Soft. Normal appearance. There is tenderness in the right lower quadrant. There is no rigidity.  Genitourinary: Vagina normal and uterus normal. There is no rash, tenderness or lesion on the right labia. There is no rash, tenderness or lesion on the left labia. Uterus is not enlarged and not tender. Cervix exhibits no motion tenderness. Right adnexum displays tenderness. Right adnexum displays no mass. Left adnexum displays no mass and no tenderness. No erythema or tenderness in the vagina. No vaginal discharge found.  Musculoskeletal: Normal range of motion.  Neurological: She is alert and oriented to person, place, and time.  Psychiatric: She has a normal mood and affect. Her behavior is normal. Thought content normal.   Vitals reviewed.   Results:  Patient Name: Shelby Young DOB: 1980-12-17 MRN: [id number]  ULTRASOUND REPORT  Location: [facility]  Date of Service: 11/23/2017    Indications:Pelvic Pain Findings:  The uterus is retroverted and measures 9.3 x 5.8 x 4.6cm. Echo texture is homogenous without evidence of focal masses.  The Endometrium measures 10.9 mm.  Right Ovary measures 3.5 x 3.0 x 2.0 cm with dominant follicle seen.  Left Ovary measures 3.8 x 2.3 x 1.7 cm. It is normal in appearance. Survey of the adnexa demonstrates no adnexal masses. Small amount of complex fluid seen near right ovary.   Impression: 1. Complex fluid seen near right ovary, otherwise normal gyn ultrasound.  Recommendations: 1.Clinical correlation with  the patient's History and Physical Exam.  Vita Barley, RDMS RVT  Assessment/Plan: RLQ abdominal pain - Tender on exam. GYN u/s with fluid near RTO. Possible ruptured ovar cyst. Sx improved. NSAIDs/reassurance. Discussed BC as prevention prn. Pt declines for now - Plan: US PELVIS TRANSVANGINAL NON-OB (TV ONLY)    Return if symptoms worsen or fail to improve.  Marnell Mcdaniel B. Nyeli Holtmeyer, PA-C 11/23/2017 4:41 PM

## 2017-11-23 NOTE — Patient Instructions (Signed)
I value your feedback and entrusting us with your care. If you get a [facility] patient survey, I would appreciate you taking the time to let us know about your experience today. Thank you! 

## 2017-11-25 ENCOUNTER — Other Ambulatory Visit: Payer: Self-pay | Admitting: Orthopedic Surgery

## 2017-11-25 DIAGNOSIS — M5416 Radiculopathy, lumbar region: Secondary | ICD-10-CM

## 2017-12-10 ENCOUNTER — Ambulatory Visit
Admission: RE | Admit: 2017-12-10 | Discharge: 2017-12-10 | Disposition: A | Discharge status: Home / Self Care | Payer: 59 | Source: Ambulatory Visit | Attending: Orthopedic Surgery | Admitting: Orthopedic Surgery

## 2017-12-10 DIAGNOSIS — M4807 Spinal stenosis, lumbosacral region: Secondary | ICD-10-CM | POA: Insufficient documentation

## 2017-12-10 DIAGNOSIS — M5416 Radiculopathy, lumbar region: Secondary | ICD-10-CM | POA: Diagnosis not present

## 2017-12-10 DIAGNOSIS — M5126 Other intervertebral disc displacement, lumbar region: Secondary | ICD-10-CM | POA: Insufficient documentation

## 2018-03-15 ENCOUNTER — Encounter: Payer: Self-pay | Admitting: Obstetrics and Gynecology

## 2018-03-15 ENCOUNTER — Other Ambulatory Visit (HOSPITAL_COMMUNITY)
Admission: RE | Admit: 2018-03-15 | Discharge: 2018-03-15 | Disposition: A | Discharge status: Home / Self Care | Payer: 59 | Source: Ambulatory Visit | Attending: Obstetrics and Gynecology | Admitting: Obstetrics and Gynecology

## 2018-03-15 ENCOUNTER — Ambulatory Visit (INDEPENDENT_AMBULATORY_CARE_PROVIDER_SITE_OTHER): Payer: 59 | Admitting: Obstetrics and Gynecology

## 2018-03-15 VITALS — BP 118/72 | HR 78 | Ht 61.0 in | Wt 131.0 lb

## 2018-03-15 DIAGNOSIS — Z01419 Encounter for gynecological examination (general) (routine) without abnormal findings: Secondary | ICD-10-CM | POA: Diagnosis not present

## 2018-03-15 DIAGNOSIS — Z124 Encounter for screening for malignant neoplasm of cervix: Secondary | ICD-10-CM | POA: Diagnosis not present

## 2018-03-15 DIAGNOSIS — Z1151 Encounter for screening for human papillomavirus (HPV): Secondary | ICD-10-CM | POA: Insufficient documentation

## 2018-03-15 DIAGNOSIS — Z0000 Encounter for general adult medical examination without abnormal findings: Secondary | ICD-10-CM

## 2018-03-15 DIAGNOSIS — Z13220 Encounter for screening for lipoid disorders: Secondary | ICD-10-CM

## 2018-03-15 DIAGNOSIS — N644 Mastodynia: Secondary | ICD-10-CM

## 2018-03-15 NOTE — Patient Instructions (Signed)
I value your feedback and entrusting us with your care. If you get a [facility] patient survey, I would appreciate you taking the time to let us know about your experience today. Thank you! 

## 2018-03-15 NOTE — Progress Notes (Signed)
PCP:  Chad Cordial, PA-C   Chief Complaint  Patient presents with  . Gynecologic Exam    breast pain, most of the time on left, no other symptoms     HPI:      [name] is a 38 y.o. G3P3 who LMP was Patient's last menstrual period was 03/03/2018 (exact date)., presents today for her annual examination.  Her menses are regular every 28-30 days, lasting 5 days. Hx of menorrhagia with failed attempt of endometrial ablation in the past. Has heavy flow for first 24-36 hrs, then lighter and normal. Declines other BC. Dysmenorrhea mild to moderate, occurring . She does not usually have intermenstrual bleeding but had 1 episode last month after running. RLQ pain from 11/19 resolved, had questionable ruptured RTO cyst on u/s.   Sex activity: single partner, contraception - vasectomy.  Last Pap: 12/22/16  Results were: no abnormalities /neg HPV DNA 2017. She likes yearly paps. Hx of STDs: none  There is a FH of breast cancer in her PGM, genetic testing not indicated. There is no FH of ovarian cancer. The patient does do self-breast exams. She has had LT breast pain for several months, no masses. Had similar sx a few yrs ago and saw Dr. Bary Castilla. Drinks a good bit of caffeine.   Tobacco use: The patient denies current or previous tobacco use. Alcohol use: social drinker No drug use.  Exercise: moderately active  She does get adequate calcium but not Vitamin D in her diet.  She had borderline lipids and Vitamin D deficiency 2016 with normal HgA1C. Due for repeat labs. Never did them last yr.   Past Medical History:  Diagnosis Date  . Endometrial polyp 12/2013  . Genital herpes    type 1  . Menorrhagia   . Reflux gastritis 2018    Past Surgical History:  Procedure Laterality Date  . breast lift  2016  . ESOPHAGOGASTRODUODENOSCOPY (EGD) WITH PROPOFOL N/A 05/14/2016   Procedure: ESOPHAGOGASTRODUODENOSCOPY (EGD) WITH PROPOFOL;  Surgeon: Lucilla Lame, MD;  Location: [facility];  Service: Endoscopy;  Laterality: N/A;  . surgery for IUD removal  2007   Repair of uterus-perforation from IUD  . UPPER GASTROINTESTINAL ENDOSCOPY     reflux  . WISDOM TOOTH EXTRACTION  11/2016   [age]    Family History  Problem Relation Age of Onset  . Breast cancer Paternal Grandmother 18  . Hypertension Mother   . Diabetes Maternal Grandfather        DM type 2    Social History   Socioeconomic History  . Marital status: Married    Spouse name: Not on file  . Number of children: Not on file  . Years of education: Not on file  . Highest education level: Not on file  Occupational History  . Not on file  Social Needs  . Financial resource strain: Not on file  . Food insecurity:    Worry: Not on file    Inability: Not on file  . Transportation needs:    Medical: Not on file    Non-medical: Not on file  Tobacco Use  . Smoking status: Former Smoker    Years: 2.00    Types: Cigarettes  . Smokeless tobacco: Never Used  . Tobacco comment: smoked approx 1 pack/week in college  Substance and Sexual Activity  . Alcohol use: Yes    Alcohol/week: 3.0 standard drinks    Types: 3 Glasses of wine per week  .  Drug use: No  . Sexual activity: Yes    Birth control/protection: Surgical    Comment: vasectomy  Lifestyle  . Physical activity:    Days per week: Not on file    Minutes per session: Not on file  . Stress: Not on file  Relationships  . Social connections:    Talks on phone: Not on file    Gets together: Not on file    Attends religious service: Not on file    Active member of club or organization: Not on file    Attends meetings of clubs or organizations: Not on file    Relationship status: Not on file  . Intimate partner violence:    Fear of current or ex partner: Not on file    Emotionally abused: Not on file    Physically abused: Not on file    Forced sexual activity: Not on file  Other Topics Concern  . Not on file  Social History Narrative   . Not on file    Current Meds  Medication Sig  . cyclobenzaprine (FLEXERIL) 10 MG tablet cyclobenzaprine 10 mg tablet  Take 1 tablet twice a day by oral route.  Marland Kitchen ibuprofen (ADVIL,MOTRIN) 600 MG tablet Take 1 tablet by mouth as needed.  . meloxicam (MOBIC) 15 MG tablet Take 15 mg by mouth daily.     ROS:  Review of Systems  Constitutional: Negative for fatigue, fever and unexpected weight change.  Respiratory: Negative for cough, shortness of breath and wheezing.   Cardiovascular: Negative for chest pain, palpitations and leg swelling.  Gastrointestinal: Negative for blood in stool, constipation, diarrhea, nausea and vomiting.  Endocrine: Negative for cold intolerance, heat intolerance and polyuria.  Genitourinary: Negative for dyspareunia, dysuria, flank pain, frequency, genital sores, hematuria, menstrual problem, pelvic pain, urgency, vaginal bleeding, vaginal discharge and vaginal pain.  Musculoskeletal: Negative for back pain, joint swelling and myalgias.  Skin: Negative for rash.  Neurological: Negative for dizziness, syncope, light-headedness, numbness and headaches.  Hematological: Negative for adenopathy.  Psychiatric/Behavioral: Negative for agitation, confusion, sleep disturbance and suicidal ideas. The patient is not nervous/anxious.   BREAST: pos for tenderness  Objective: BP 118/72   Pulse 78   Ht 5\' 1"  (1.549 m)   Wt 131 lb (59.4 kg)   LMP 03/03/2018 (Exact Date)   BMI 24.75 kg/m    Physical Exam Constitutional:      Appearance: She is well-developed.  Genitourinary:     Vulva, vagina, uterus, right adnexa and left adnexa normal.     No vulval lesion or tenderness noted.     No vaginal discharge, erythema or tenderness.     No cervical motion tenderness or polyp.     Uterus is not enlarged or tender.     No right or left adnexal mass present.     Right adnexa not tender.     Left adnexa not tender.  Neck:     Musculoskeletal: Normal range of motion.      Thyroid: No thyromegaly.  Cardiovascular:     Rate and Rhythm: Normal rate and regular rhythm.     Heart sounds: Normal heart sounds. No murmur.  Pulmonary:     Effort: Pulmonary effort is normal.     Breath sounds: Normal breath sounds.  Chest:     Breasts:        Right: No mass, nipple discharge, skin change or tenderness.        Left: No mass, nipple discharge, skin change or  tenderness.  Abdominal:     Palpations: Abdomen is soft.     Tenderness: There is no abdominal tenderness. There is no guarding.  Musculoskeletal: Normal range of motion.  Neurological:     Mental Status: She is alert and oriented to person, place, and time.     Cranial Nerves: No cranial nerve deficit.  Psychiatric:        Behavior: Behavior normal.  Vitals signs reviewed.     Assessment/Plan: Encounter for annual routine gynecological examination  Cervical cancer screening - Plan: Cytology - PAP  Screening for HPV (human papillomavirus) - Plan: Cytology - PAP  Blood tests for routine general physical examination - Plan: Comprehensive metabolic panel, Lipid panel  Screening cholesterol level - Plan: Lipid panel  Breast tenderness - Neg exam. D/C caffeine, add VIt E 400 IUD BID. F/u prn.  GYN counsel adequate intake of calcium and vitamin D, diet and exercise     F/U  Return in about 1 year (around 03/16/2019).  Alicia B. Copland, PA-C 03/15/2018 4:06 PM

## 2018-03-18 LAB — CYTOLOGY - PAP
Diagnosis: NEGATIVE
HPV: NOT DETECTED

## 2018-10-17 ENCOUNTER — Telehealth: Payer: Self-pay | Admitting: Gastroenterology

## 2018-10-17 NOTE — Telephone Encounter (Signed)
Patient called & l/m on v/m wanting to schedule an appointment with Dr Allen Norris. I returned cal & l/m to call for an appointment.

## 2018-10-19 ENCOUNTER — Telehealth: Payer: Self-pay | Admitting: Gastroenterology

## 2018-10-19 NOTE — Telephone Encounter (Signed)
Pt is calling to schedule apt with Dr. Allen Norris we have scheduled her for 11/17/18 she states she swallowed a small fish bone 4-5 weeks ago. She would like to see if Dr. Allen Norris would want to order Imaging prior to her coming? Please advice and send back to [facility]

## 2018-10-20 NOTE — Telephone Encounter (Signed)
Pt notified to contact ENT. Pt would like to keep appt with Dr. Allen Norris just in case she has other issues.

## 2018-10-20 NOTE — Telephone Encounter (Signed)
The discomfort is in her neck. Should this be an ENT issue?

## 2018-10-20 NOTE — Telephone Encounter (Signed)
Yes. ENT.

## 2018-10-20 NOTE — Telephone Encounter (Signed)
Fish bones are not typically seen on X-ray. They don't have enough calcium like bones to be seen. Also, not knowing where she is having symptoms makes it a problem. Is it in her neck? Chest? Abdomen? Colon?

## 2018-10-26 DIAGNOSIS — T17298A Other foreign object in pharynx causing other injury, initial encounter: Secondary | ICD-10-CM | POA: Diagnosis not present

## 2018-10-26 DIAGNOSIS — R070 Pain in throat: Secondary | ICD-10-CM | POA: Diagnosis not present

## 2018-11-17 ENCOUNTER — Ambulatory Visit: Payer: 59 | Admitting: Gastroenterology

## 2018-12-27 DIAGNOSIS — H5203 Hypermetropia, bilateral: Secondary | ICD-10-CM | POA: Diagnosis not present

## 2019-03-28 NOTE — Patient Instructions (Addendum)
I value your feedback and entrusting us with your care. If you get a [facility] patient survey, I would appreciate you taking the time to let us know about your experience today. Thank you!  As of December 29, 2018, your lab results will be released to your MyChart immediately, before I even have a chance to see them. Please give me time to review them and contact you if there are any abnormalities. Thank you for your patience.   [facility]: [phone]  [facility]: [phone]  

## 2019-03-28 NOTE — Progress Notes (Signed)
PCP:  Chad Cordial, PA-C   Chief Complaint  Patient presents with  . Gynecologic Exam  . Vaginal Bleeding    spotting in between periods for the past 10 months     HPI:      [name] is a 39 y.o. G3P3 who LMP was Patient's last menstrual period was 03/19/2019 (exact date)., presents today for her annual examination.  Her menses are regular every 28-30 days, lasting 5 days, heavy flow for several days. Hx of menorrhagia with failed attempt of endometrial ablation in the past. Has declined other BC in the past. Dysmenorrhea mild to moderate, improved with NSAIDs/meloxicam. She has had mid cycle spotting for several days, the past 10 months. Hx of endometrial polyp in past. No dysmen with spotting. Pt tired of bleeding and planning life around periods/spotting. Interested in hyst. RLQ pain from 11/19 resolved, had questionable ruptured RTO cyst on u/s.   Sex activity: single partner, contraception - vasectomy. No pain/bleeding with sex. Last Pap: 03/15/18 Results were: no abnormalities /neg HPV DNA. She likes yearly paps. Hx of STDs: HSV 1  Last mammo: 2011 There is a FH of breast cancer in her PGM, genetic testing not indicated. There is no FH of ovarian cancer. The patient does do self-breast exams.   Tobacco use: The patient denies current or previous tobacco use. Alcohol use: social drinker No drug use.  Exercise: min active  She does get adequate calcium and Vitamin D in her diet. Hx of Vit D deficiency in past.  She had borderline lipids and Vitamin D deficiency 2016 with normal HgA1C. Due for repeat lipids.   Past Medical History:  Diagnosis Date  . Endometrial polyp 12/2013  . Genital herpes    type 1  . Menorrhagia   . Reflux gastritis 2018    Past Surgical History:  Procedure Laterality Date  . breast lift  2016  . ESOPHAGOGASTRODUODENOSCOPY (EGD) WITH PROPOFOL N/A 05/14/2016   Procedure: ESOPHAGOGASTRODUODENOSCOPY (EGD) WITH PROPOFOL;  Surgeon:  Lucilla Lame, MD;  Location: [facility];  Service: Endoscopy;  Laterality: N/A;  . surgery for IUD removal  2007   Repair of uterus-perforation from IUD  . UPPER GASTROINTESTINAL ENDOSCOPY     reflux  . WISDOM TOOTH EXTRACTION  11/2016   [age]    Family History  Problem Relation Age of Onset  . Breast cancer Paternal Grandmother 74  . Hypertension Mother   . Diabetes Maternal Grandfather        DM type 2    Social History   Socioeconomic History  . Marital status: Married    Spouse name: Not on file  . Number of children: Not on file  . Years of education: Not on file  . Highest education level: Not on file  Occupational History  . Not on file  Tobacco Use  . Smoking status: Former Smoker    Years: 2.00    Types: Cigarettes  . Smokeless tobacco: Never Used  . Tobacco comment: smoked approx 1 pack/week in college  Substance and Sexual Activity  . Alcohol use: Yes    Alcohol/week: 3.0 standard drinks    Types: 3 Glasses of wine per week  . Drug use: No  . Sexual activity: Yes    Birth control/protection: Surgical    Comment: vasectomy  Other Topics Concern  . Not on file  Social History Narrative  . Not on file   Social Determinants of Health   Financial Resource Strain:   .  Difficulty of Paying Living Expenses: Not on file  Food Insecurity:   . Worried About Charity fundraiser in the Last Year: Not on file  . Ran Out of Food in the Last Year: Not on file  Transportation Needs:   . Lack of Transportation (Medical): Not on file  . Lack of Transportation (Non-Medical): Not on file  Physical Activity:   . Days of Exercise per Week: Not on file  . Minutes of Exercise per Session: Not on file  Stress:   . Feeling of Stress : Not on file  Social Connections:   . Frequency of Communication with Friends and Family: Not on file  . Frequency of Social Gatherings with Friends and Family: Not on file  . Attends Religious Services: Not on file  . Active  Member of Clubs or Organizations: Not on file  . Attends Archivist Meetings: Not on file  . Marital Status: Not on file  Intimate Partner Violence:   . Fear of Current or Ex-Partner: Not on file  . Emotionally Abused: Not on file  . Physically Abused: Not on file  . Sexually Abused: Not on file    Current Meds  Medication Sig  . cyclobenzaprine (FLEXERIL) 10 MG tablet cyclobenzaprine 10 mg tablet  Take 1 tablet twice a day by oral route.  Marland Kitchen ibuprofen (ADVIL,MOTRIN) 600 MG tablet Take 1 tablet by mouth as needed.  . meloxicam (MOBIC) 15 MG tablet Take 15 mg by mouth daily.  . methocarbamol (ROBAXIN) 500 MG tablet methocarbamol 500 mg tablet     ROS:  Review of Systems  Constitutional: Negative for fatigue, fever and unexpected weight change.  Respiratory: Negative for cough, shortness of breath and wheezing.   Cardiovascular: Negative for chest pain, palpitations and leg swelling.  Gastrointestinal: Negative for blood in stool, constipation, diarrhea, nausea and vomiting.  Endocrine: Negative for cold intolerance, heat intolerance and polyuria.  Genitourinary: Positive for menstrual problem. Negative for dyspareunia, dysuria, flank pain, frequency, genital sores, hematuria, pelvic pain, urgency, vaginal bleeding, vaginal discharge and vaginal pain.  Musculoskeletal: Negative for back pain, joint swelling and myalgias.  Skin: Negative for rash.  Neurological: Negative for dizziness, syncope, light-headedness, numbness and headaches.  Hematological: Negative for adenopathy.  Psychiatric/Behavioral: Negative for agitation, confusion, sleep disturbance and suicidal ideas. The patient is not nervous/anxious.   BREAST: pos for tenderness  Objective: BP 100/80   Ht 5\' 1"  (1.549 m)   Wt 127 lb (57.6 kg)   LMP 03/19/2019 (Exact Date)   BMI 24.00 kg/m    Physical Exam Constitutional:      Appearance: She is well-developed.  Genitourinary:     Vulva, vagina, uterus,  right adnexa and left adnexa normal.     No vulval lesion or tenderness noted.     No vaginal discharge, erythema or tenderness.     No cervical motion tenderness or polyp.     Uterus is not enlarged or tender.     No right or left adnexal mass present.     Right adnexa not tender.     Left adnexa not tender.  Neck:     Thyroid: No thyromegaly.  Cardiovascular:     Rate and Rhythm: Normal rate and regular rhythm.     Heart sounds: Normal heart sounds. No murmur.  Pulmonary:     Effort: Pulmonary effort is normal.     Breath sounds: Normal breath sounds.  Chest:     Breasts:  Right: No mass, nipple discharge, skin change or tenderness.        Left: No mass, nipple discharge, skin change or tenderness.  Abdominal:     Palpations: Abdomen is soft.     Tenderness: There is no abdominal tenderness. There is no guarding.  Musculoskeletal:        General: Normal range of motion.     Cervical back: Normal range of motion.  Neurological:     General: No focal deficit present.     Mental Status: She is alert and oriented to person, place, and time.     Cranial Nerves: No cranial nerve deficit.  Skin:    General: Skin is warm and dry.  Psychiatric:        Mood and Affect: Mood normal.        Behavior: Behavior normal.        Thought Content: Thought content normal.        Judgment: Judgment normal.  Vitals reviewed.     Assessment/Plan: Encounter for annual routine gynecological examination  Cervical cancer screening - Plan: Cytology - PAP; pt likes yearly paps  Encounter for screening mammogram for malignant neoplasm of breast; pt to sched mammo  Abnormal uterine bleeding (AUB) - Plan: TSH + free T4; Check labs. If neg, will sched GYN u/s day 5-9 of cycle. Will f/u with results/mgmt plan. Pt also then interested in hyst.  Blood tests for routine general physical examination - Plan: TSH + free T4, Comprehensive metabolic panel, Lipid panel  Screening cholesterol level -  Plan: Lipid panel  Thyroid disorder screening - Plan: TSH + free T4  GYN counsel adequate intake of calcium and vitamin D, diet and exercise     F/U  Return in about 1 year (around 03/28/2020).  Mikyah Alamo B. Hesston Hitchens, PA-C 03/29/2019 10:40 AM

## 2019-03-29 ENCOUNTER — Other Ambulatory Visit (HOSPITAL_COMMUNITY)
Admission: RE | Admit: 2019-03-29 | Discharge: 2019-03-29 | Disposition: A | Discharge status: Home / Self Care | Payer: 59 | Source: Ambulatory Visit | Attending: Obstetrics and Gynecology | Admitting: Obstetrics and Gynecology

## 2019-03-29 ENCOUNTER — Other Ambulatory Visit: Payer: Self-pay

## 2019-03-29 ENCOUNTER — Encounter: Payer: Self-pay | Admitting: Obstetrics and Gynecology

## 2019-03-29 ENCOUNTER — Ambulatory Visit (INDEPENDENT_AMBULATORY_CARE_PROVIDER_SITE_OTHER): Payer: 59 | Admitting: Obstetrics and Gynecology

## 2019-03-29 VITALS — BP 100/80 | Ht 61.0 in | Wt 127.0 lb

## 2019-03-29 DIAGNOSIS — Z124 Encounter for screening for malignant neoplasm of cervix: Secondary | ICD-10-CM

## 2019-03-29 DIAGNOSIS — Z1329 Encounter for screening for other suspected endocrine disorder: Secondary | ICD-10-CM

## 2019-03-29 DIAGNOSIS — Z803 Family history of malignant neoplasm of breast: Secondary | ICD-10-CM

## 2019-03-29 DIAGNOSIS — Z13220 Encounter for screening for lipoid disorders: Secondary | ICD-10-CM

## 2019-03-29 DIAGNOSIS — Z0000 Encounter for general adult medical examination without abnormal findings: Secondary | ICD-10-CM

## 2019-03-29 DIAGNOSIS — N938 Other specified abnormal uterine and vaginal bleeding: Secondary | ICD-10-CM

## 2019-03-29 DIAGNOSIS — N939 Abnormal uterine and vaginal bleeding, unspecified: Secondary | ICD-10-CM

## 2019-03-29 DIAGNOSIS — Z1231 Encounter for screening mammogram for malignant neoplasm of breast: Secondary | ICD-10-CM

## 2019-03-29 DIAGNOSIS — Z01419 Encounter for gynecological examination (general) (routine) without abnormal findings: Secondary | ICD-10-CM

## 2019-03-30 LAB — COMPREHENSIVE METABOLIC PANEL
ALT: 10 IU/L (ref 0–32)
AST: 17 IU/L (ref 0–40)
Albumin/Globulin Ratio: 1.6 (ref 1.2–2.2)
Albumin: 4.5 g/dL (ref 3.8–4.8)
Alkaline Phosphatase: 58 IU/L (ref 39–117)
BUN/Creatinine Ratio: 10 (ref 9–23)
BUN: 9 mg/dL (ref 6–24)
Bilirubin Total: 0.3 mg/dL (ref 0.0–1.2)
CO2: 24 mmol/L (ref 20–29)
Calcium: 9.7 mg/dL (ref 8.7–10.2)
Chloride: 103 mmol/L (ref 96–106)
Creatinine, Ser: 0.86 mg/dL (ref 0.57–1.00)
GFR calc Af Amer: 98 mL/min/{1.73_m2} (ref >59)
GFR calc non Af Amer: 85 mL/min/{1.73_m2} (ref >59)
Globulin, Total: 2.9 g/dL (ref 1.5–4.5)
Glucose: 84 mg/dL (ref 65–99)
Potassium: 4.6 mmol/L (ref 3.5–5.2)
Sodium: 140 mmol/L (ref 134–144)
Total Protein: 7.4 g/dL (ref 6.0–8.5)

## 2019-03-30 LAB — LIPID PANEL
Chol/HDL Ratio: 2.3 ratio (ref 0.0–4.4)
Cholesterol, Total: 239 mg/dL — ABNORMAL HIGH (ref 100–199)
HDL: 102 mg/dL (ref >39)
LDL Chol Calc (NIH): 123 mg/dL — ABNORMAL HIGH (ref 0–99)
Triglycerides: 83 mg/dL (ref 0–149)
VLDL Cholesterol Cal: 14 mg/dL (ref 5–40)

## 2019-03-30 LAB — TSH+FREE T4
Free T4: 1.02 ng/dL (ref 0.82–1.77)
TSH: 2.86 u[IU]/mL (ref 0.450–4.500)

## 2019-03-31 LAB — CYTOLOGY - PAP: Diagnosis: NEGATIVE

## 2019-04-19 ENCOUNTER — Telehealth: Payer: Self-pay | Admitting: Obstetrics and Gynecology

## 2019-04-19 DIAGNOSIS — N939 Abnormal uterine and vaginal bleeding, unspecified: Secondary | ICD-10-CM

## 2019-04-19 NOTE — Telephone Encounter (Signed)
Patient calling to see about getting ultrasound scheduled for AUB.  Please advise if u/s and follow up or u/s and call with results and will need order.  I can then give her a call back and get her scheduled.  Patient aware ABC off this afternoon, back 4/1.   Thanks.

## 2019-04-20 NOTE — Telephone Encounter (Signed)
Pls call pt to sched GYN u/s for AUB, day 5-9 of cycle approx. ABC to call pt with results. Order placed. Thx.

## 2019-04-20 NOTE — Telephone Encounter (Signed)
Scheduled

## 2019-04-26 ENCOUNTER — Other Ambulatory Visit: Payer: Self-pay

## 2019-04-26 ENCOUNTER — Ambulatory Visit (INDEPENDENT_AMBULATORY_CARE_PROVIDER_SITE_OTHER): Payer: 59

## 2019-04-26 DIAGNOSIS — N938 Other specified abnormal uterine and vaginal bleeding: Secondary | ICD-10-CM | POA: Diagnosis not present

## 2019-04-26 DIAGNOSIS — N939 Abnormal uterine and vaginal bleeding, unspecified: Secondary | ICD-10-CM

## 2019-05-02 ENCOUNTER — Telehealth: Payer: Self-pay | Admitting: Obstetrics and Gynecology

## 2019-05-02 DIAGNOSIS — N920 Excessive and frequent menstruation with regular cycle: Secondary | ICD-10-CM

## 2019-05-02 MED ORDER — TRANEXAMIC ACID 650 MG PO TABS: 1300.0000 mg | ORAL_TABLET | Freq: Three times a day (TID) | ORAL | 11 refills | Status: AC

## 2019-05-02 NOTE — Telephone Encounter (Signed)
Spoke with pt re: GYN u/s results. Has menorrhagia, endometrial ablation failed past. Has a few days light spotting mid cycle for past 10 months. GYN U/S neg except suggestive of adenomyosis.  Pt's AUB sx resolved this month. Had wanted hyst at recent appt but wants to follow sx for now. Not interested in BC/IUD. Discussed lysteda to try. Pt interested, Rx eRxd. F/u prn. If sx persist, pt to sched appt with MD for hyst consult.   ULTRASOUND REPORT  Location: [facility]  Date of Service: 04/26/2019   Indications:Abnormal Uterine Bleeding  Findings:  The uterus is retroverted and measures 8.5 x 5.7 x 5.0 cm. Echo texture is homogenous without evidence of focal masses. The Endometrium measures 9.4 mm.  There is asymmetry of thickness of the anterior myometrium as compared to the posterior myometrium.   Right Ovary measures 4.0 x 2.4 x 1.8 cm. It is normal in appearance. Left Ovary measures 3.3 x 2.5 x 2.5 cm. It is normal in appearance. Survey of the adnexa demonstrates no adnexal masses. There is no free fluid in the cul de sac.  Impression: 1. Asymmetry between the anterior and posterior myometrial walls.  This could be artifact. Consider adenomyosis as part of the differential. 2. Otherwise, normal pelvic ultrasound.   Gweneth Dimitri, RT   The ultrasound images and findings were reviewed by me and I agree with the above report.  Prentice Docker, MD, [facility], [facility] 04/28/2019 10:09 AM

## 2019-06-25 ENCOUNTER — Telehealth: Payer: 59

## 2020-08-16 ENCOUNTER — Other Ambulatory Visit: Payer: Self-pay

## 2020-08-16 MED ORDER — METHOCARBAMOL 500 MG PO TABS
ORAL_TABLET | ORAL | 2 refills | Status: AC
  Filled 2020-08-16 – 2020-09-02 (×2): qty 60, 15d supply, fill #0

## 2020-08-27 ENCOUNTER — Other Ambulatory Visit: Payer: Self-pay

## 2020-09-02 ENCOUNTER — Other Ambulatory Visit: Payer: Self-pay

## 2021-06-04 ENCOUNTER — Other Ambulatory Visit: Payer: Self-pay | Admitting: Obstetrics and Gynecology

## 2021-06-04 DIAGNOSIS — R928 Other abnormal and inconclusive findings on diagnostic imaging of breast: Secondary | ICD-10-CM

## 2021-06-12 ENCOUNTER — Ambulatory Visit
Admission: RE | Admit: 2021-06-12 | Discharge: 2021-06-12 | Disposition: A | Discharge status: Home / Self Care | Payer: Commercial Managed Care - PPO | Source: Ambulatory Visit | Attending: Obstetrics and Gynecology | Admitting: Obstetrics and Gynecology

## 2021-06-12 ENCOUNTER — Ambulatory Visit
Admission: RE | Admit: 2021-06-12 | Discharge: 2021-06-12 | Disposition: A | Discharge status: Home / Self Care | Payer: 59 | Source: Ambulatory Visit | Attending: Obstetrics and Gynecology | Admitting: Obstetrics and Gynecology

## 2021-06-12 DIAGNOSIS — R928 Other abnormal and inconclusive findings on diagnostic imaging of breast: Secondary | ICD-10-CM

## 2024-04-03 IMAGING — MG MM DIGITAL DIAGNOSTIC UNILAT*R* IMPLANT W/ TOMO W/ CAD
6 series · 6 of 18 positions shown · non-contrast
Comparison: Previous exam(s).

CLINICAL DATA: Patient recalled from screening for right breast
asymmetry.

EXAM:
DIGITAL DIAGNOSTIC UNILATERAL RIGHT MAMMOGRAM WITH IMPLANTS, CAD AND
TOMOSYNTHESIS; ULTRASOUND RIGHT BREAST LIMITED
TECHNIQUE: Right digital diagnostic mammography and breast tomosynthesis was
performed. The images were evaluated with computer-aided detection.
Standard and/or implant displaced views were performed.; Targeted
ultrasound examination of the right breast was performed

[R ML synth-2D]
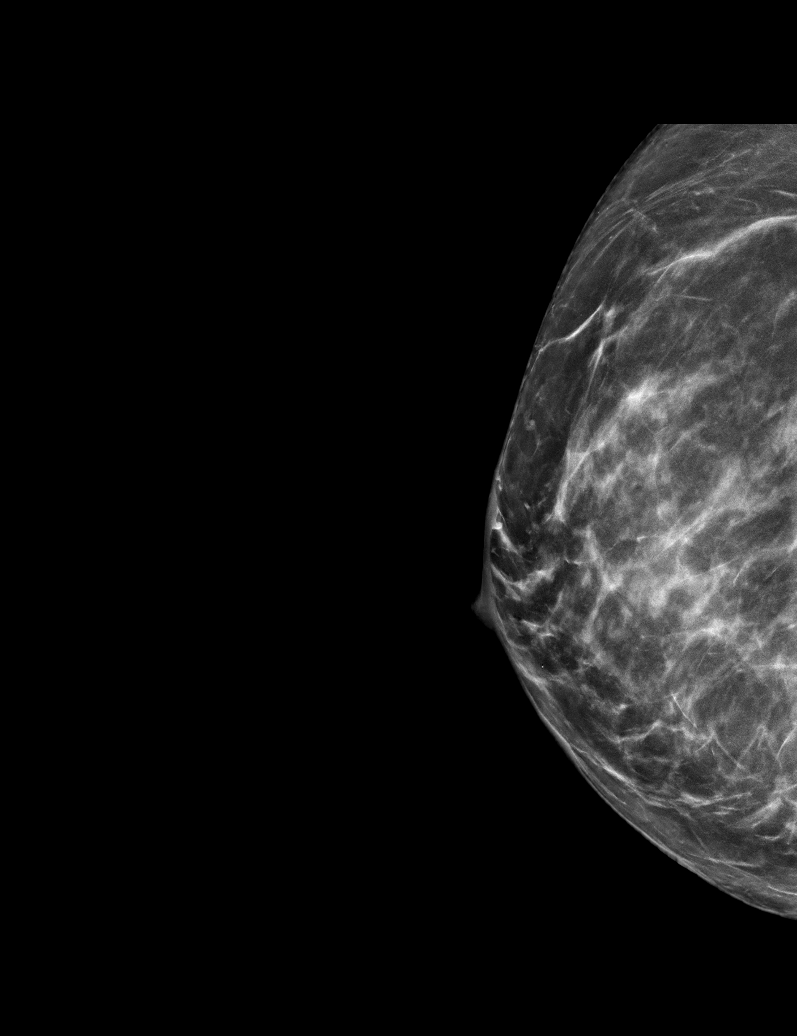

[R CC synth-2D (1 of 2)]
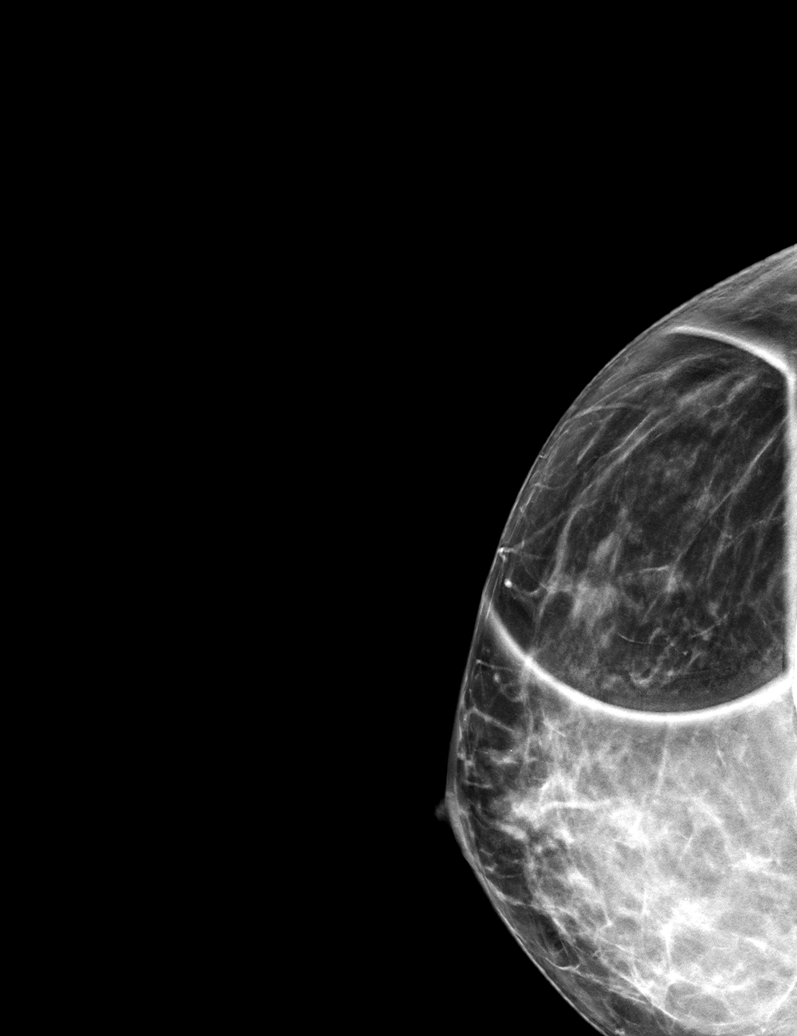

[R CC synth-2D (2 of 2)]
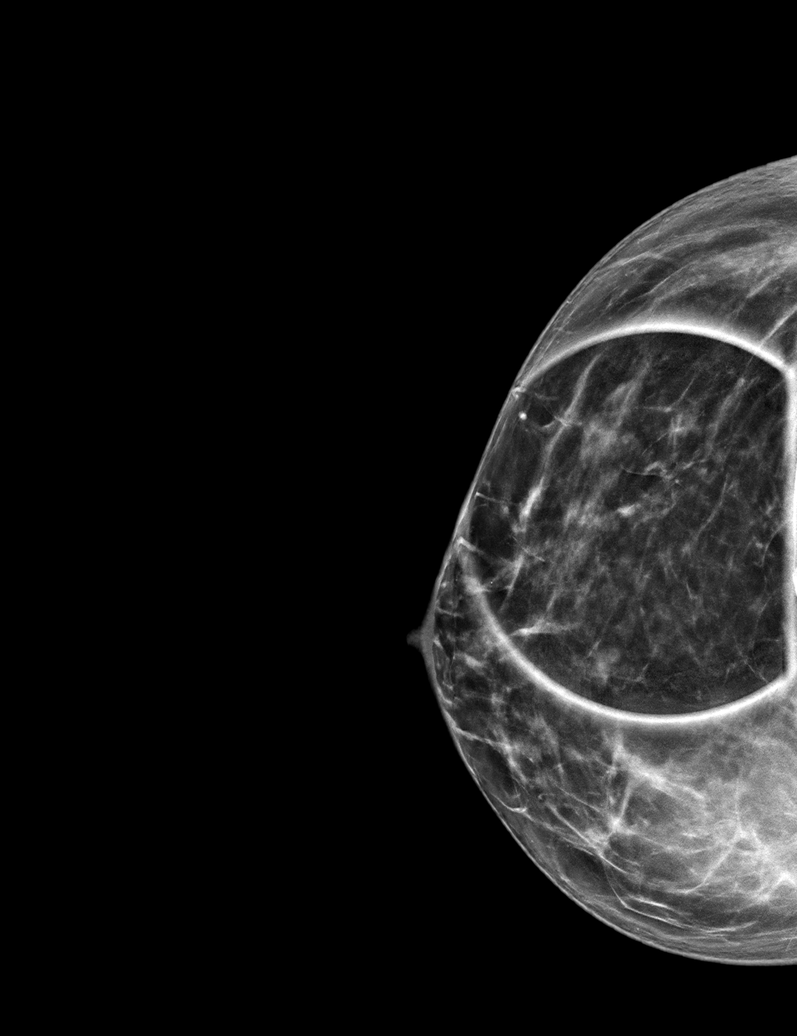

[R ML tomo · tomo slice 35/69.0]
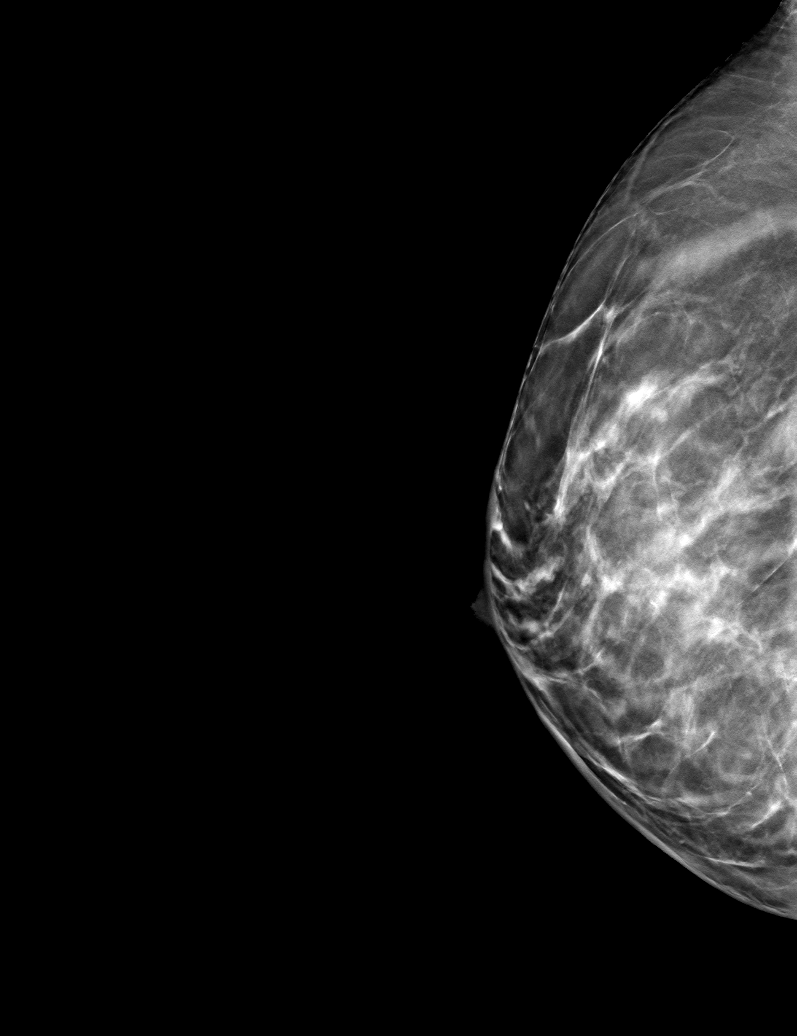

[R CCID BREAST TOMOSYNTHESIS IMAGE tomo · tomo slice 29/57.0]
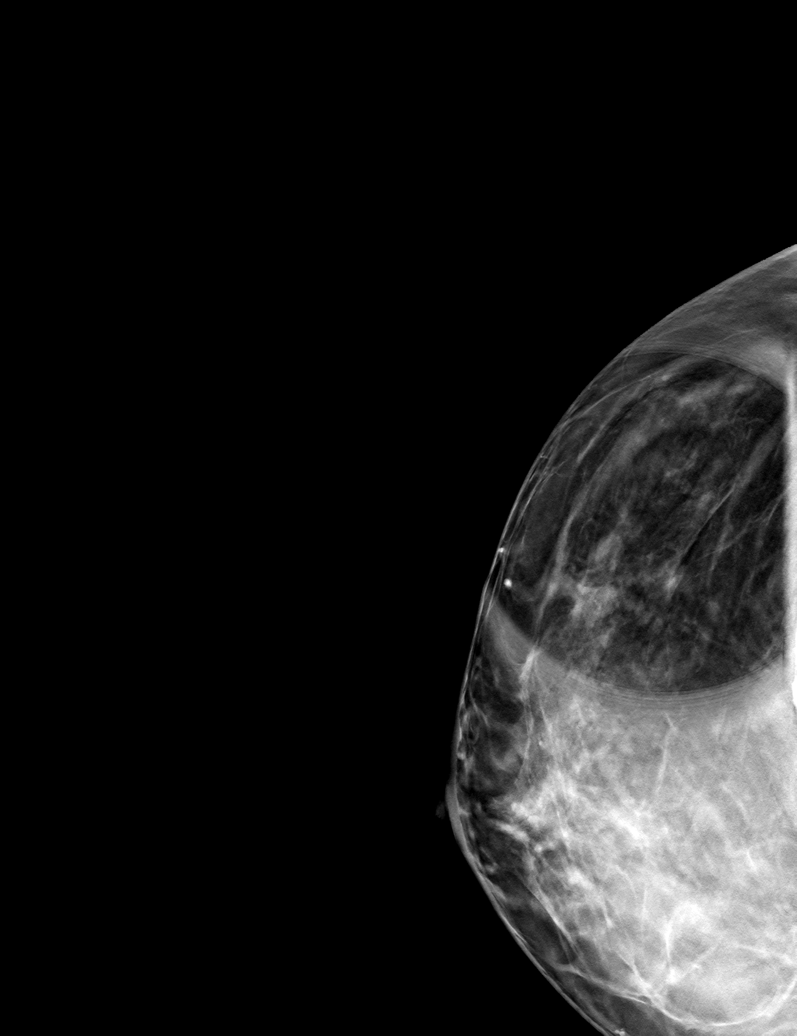

[R CC tomo · tomo slice 31/62.0]
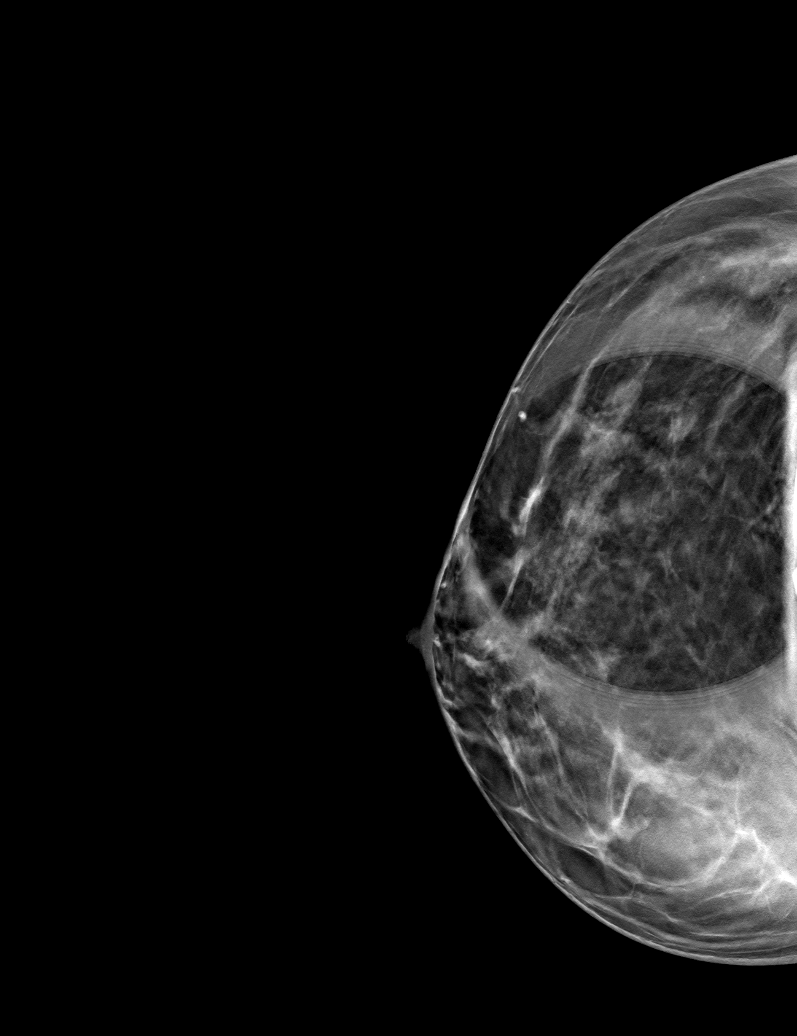

[6 of 18 positions shown; findings below may reference images not displayed]

ACR Breast Density Category c: The breast tissue is heterogeneously
dense, which may obscure small masses.
FINDINGS: Additional imaging of the outer right breast was obtained.
Questioned asymmetry partially effaced with additional imaging. The
patient has retropectoral implants.

Targeted ultrasound is performed, showing a 10 x 11 x 3 mm cyst
right breast 9 o'clock position 4 cm from the nipple.
IMPRESSION: Right breast cyst.  No mammographic evidence for malignancy.

RECOMMENDATION:
Screening mammogram in one year.(Code:[id number])

I have discussed the findings and recommendations with the patient.
If applicable, a reminder letter will be sent to the patient
regarding the next appointment.

BI-RADS CATEGORY  2: Benign.
